# Patient Record
Sex: Male | Born: 1975 | Race: White | Hispanic: No | Marital: Married | Smoking: Current every day smoker
Health system: Southern US, Community
[De-identification: ages and names within clinical notes are randomized; demographics above are authoritative.]

## PROBLEM LIST (undated history)

## (undated) DIAGNOSIS — K6389 Other specified diseases of intestine: Secondary | ICD-10-CM

## (undated) DIAGNOSIS — K529 Noninfective gastroenteritis and colitis, unspecified: Secondary | ICD-10-CM

## (undated) DIAGNOSIS — J351 Hypertrophy of tonsils: Secondary | ICD-10-CM

## (undated) DIAGNOSIS — K5792 Diverticulitis of intestine, part unspecified, without perforation or abscess without bleeding: Secondary | ICD-10-CM

## (undated) DIAGNOSIS — I10 Essential (primary) hypertension: Secondary | ICD-10-CM

## (undated) HISTORY — PX: FOOT SURGERY: SHX648

---

## 2004-11-23 ENCOUNTER — Emergency Department: Payer: Self-pay | Admitting: Emergency Medicine

## 2006-09-19 ENCOUNTER — Emergency Department: Payer: Self-pay | Admitting: Emergency Medicine

## 2007-04-10 ENCOUNTER — Emergency Department: Payer: Self-pay | Admitting: Emergency Medicine

## 2007-08-13 ENCOUNTER — Emergency Department: Payer: Self-pay | Admitting: Emergency Medicine

## 2007-11-22 ENCOUNTER — Other Ambulatory Visit: Payer: Self-pay

## 2007-11-22 ENCOUNTER — Emergency Department: Payer: Self-pay | Admitting: Emergency Medicine

## 2007-11-27 ENCOUNTER — Emergency Department: Payer: Self-pay | Admitting: Emergency Medicine

## 2010-10-01 ENCOUNTER — Emergency Department: Payer: Self-pay | Admitting: Emergency Medicine

## 2010-12-08 ENCOUNTER — Emergency Department: Payer: Self-pay | Admitting: Emergency Medicine

## 2011-01-20 ENCOUNTER — Emergency Department: Payer: Self-pay | Admitting: Emergency Medicine

## 2011-06-23 ENCOUNTER — Emergency Department: Payer: Self-pay | Admitting: Emergency Medicine

## 2011-07-23 ENCOUNTER — Emergency Department: Payer: Self-pay | Admitting: Emergency Medicine

## 2011-07-23 LAB — COMPREHENSIVE METABOLIC PANEL
Albumin: 3.5 g/dL (ref 3.4–5.0)
Anion Gap: 9 (ref 7–16)
Chloride: 108 mmol/L — ABNORMAL HIGH (ref 98–107)
Creatinine: 0.75 mg/dL (ref 0.60–1.30)
EGFR (African American): >60
Glucose: 96 mg/dL (ref 65–99)
Potassium: 3.6 mmol/L (ref 3.5–5.1)
SGPT (ALT): 29 U/L
Total Protein: 6.6 g/dL (ref 6.4–8.2)

## 2011-07-23 LAB — SEDIMENTATION RATE: Erythrocyte Sed Rate: 1 mm/hr (ref 0–15)

## 2011-07-23 LAB — CBC
MCH: 29.9 pg (ref 26.0–34.0)
MCHC: 32.8 g/dL (ref 32.0–36.0)
MCV: 91 fL (ref 80–100)
Platelet: 101 10*3/uL — ABNORMAL LOW (ref 150–440)
RBC: 5 10*6/uL (ref 4.40–5.90)
RDW: 12.9 % (ref 11.5–14.5)

## 2011-07-29 LAB — CULTURE, BLOOD (SINGLE)

## 2011-08-18 ENCOUNTER — Emergency Department: Payer: Self-pay | Admitting: Emergency Medicine

## 2011-08-18 LAB — CBC
HCT: 43.4 % (ref 40.0–52.0)
MCH: 30.2 pg (ref 26.0–34.0)
MCV: 90 fL (ref 80–100)
RBC: 4.82 10*6/uL (ref 4.40–5.90)
RDW: 12.9 % (ref 11.5–14.5)
WBC: 6.3 10*3/uL (ref 3.8–10.6)

## 2011-08-18 LAB — BASIC METABOLIC PANEL
Chloride: 107 mmol/L (ref 98–107)
Co2: 28 mmol/L (ref 21–32)
EGFR (Non-African Amer.): >60
Glucose: 95 mg/dL (ref 65–99)
Osmolality: 279 (ref 275–301)
Sodium: 141 mmol/L (ref 136–145)

## 2011-08-18 LAB — CK TOTAL AND CKMB (NOT AT ARMC): CK, Total: 105 U/L (ref 35–232)

## 2011-08-31 ENCOUNTER — Emergency Department: Payer: Self-pay | Admitting: Emergency Medicine

## 2011-08-31 LAB — CBC
HCT: 42.8 % (ref 40.0–52.0)
Platelet: 108 10*3/uL — ABNORMAL LOW (ref 150–440)
RDW: 13.2 % (ref 11.5–14.5)
WBC: 7.6 10*3/uL (ref 3.8–10.6)

## 2011-08-31 LAB — COMPREHENSIVE METABOLIC PANEL
Albumin: 3.8 g/dL (ref 3.4–5.0)
Alkaline Phosphatase: 107 U/L (ref 50–136)
Anion Gap: 6 — ABNORMAL LOW (ref 7–16)
BUN: 15 mg/dL (ref 7–18)
Bilirubin,Total: 0.4 mg/dL (ref 0.2–1.0)
Creatinine: 1.09 mg/dL (ref 0.60–1.30)
Glucose: 95 mg/dL (ref 65–99)
Osmolality: 282 (ref 275–301)
Potassium: 4 mmol/L (ref 3.5–5.1)
Sodium: 141 mmol/L (ref 136–145)
Total Protein: 7.1 g/dL (ref 6.4–8.2)

## 2011-10-18 ENCOUNTER — Emergency Department: Payer: Self-pay | Admitting: Emergency Medicine

## 2011-10-20 ENCOUNTER — Emergency Department: Payer: Self-pay | Admitting: Emergency Medicine

## 2012-02-25 ENCOUNTER — Emergency Department: Payer: Self-pay | Admitting: Emergency Medicine

## 2012-02-25 LAB — URINALYSIS, COMPLETE
Blood: NEGATIVE
Ketone: NEGATIVE
Leukocyte Esterase: NEGATIVE
Nitrite: NEGATIVE
Ph: 5 (ref 4.5–8.0)
Protein: NEGATIVE

## 2012-02-25 LAB — CBC
HCT: 50 % (ref 40.0–52.0)
HGB: 17.2 g/dL (ref 13.0–18.0)
MCHC: 34.3 g/dL (ref 32.0–36.0)
MCV: 90 fL (ref 80–100)
Platelet: 158 10*3/uL (ref 150–440)
RDW: 13.3 % (ref 11.5–14.5)
WBC: 10.4 10*3/uL (ref 3.8–10.6)

## 2012-03-03 ENCOUNTER — Emergency Department (HOSPITAL_COMMUNITY)
Admission: EM | Admit: 2012-03-03 | Discharge: 2012-03-03 | Disposition: A | Discharge status: Home / Self Care | Payer: Self-pay | Attending: Emergency Medicine | Admitting: Emergency Medicine

## 2012-03-03 ENCOUNTER — Emergency Department (HOSPITAL_COMMUNITY): Payer: Self-pay

## 2012-03-03 ENCOUNTER — Encounter (HOSPITAL_COMMUNITY): Payer: Self-pay | Admitting: *Deleted

## 2012-03-03 DIAGNOSIS — K5900 Constipation, unspecified: Secondary | ICD-10-CM | POA: Insufficient documentation

## 2012-03-03 DIAGNOSIS — R350 Frequency of micturition: Secondary | ICD-10-CM | POA: Insufficient documentation

## 2012-03-03 DIAGNOSIS — K6389 Other specified diseases of intestine: Secondary | ICD-10-CM | POA: Insufficient documentation

## 2012-03-03 DIAGNOSIS — R3915 Urgency of urination: Secondary | ICD-10-CM | POA: Insufficient documentation

## 2012-03-03 DIAGNOSIS — F17200 Nicotine dependence, unspecified, uncomplicated: Secondary | ICD-10-CM | POA: Insufficient documentation

## 2012-03-03 DIAGNOSIS — R300 Dysuria: Secondary | ICD-10-CM | POA: Insufficient documentation

## 2012-03-03 LAB — CBC WITH DIFFERENTIAL/PLATELET
HCT: 45.9 % (ref 39.0–52.0)
Hemoglobin: 15.8 g/dL (ref 13.0–17.0)
Lymphs Abs: 1.8 10*3/uL (ref 0.7–4.0)
MCHC: 34.4 g/dL (ref 30.0–36.0)
Monocytes Absolute: 0.4 10*3/uL (ref 0.1–1.0)
Monocytes Relative: 6 % (ref 3–12)
Neutro Abs: 4 10*3/uL (ref 1.7–7.7)
Neutrophils Relative %: 62 % (ref 43–77)
RBC: 5.27 MIL/uL (ref 4.22–5.81)

## 2012-03-03 LAB — POCT I-STAT, CHEM 8
BUN: 16 mg/dL (ref 6–23)
Creatinine, Ser: 0.9 mg/dL (ref 0.50–1.35)
Hemoglobin: 16 g/dL (ref 13.0–17.0)
Potassium: 3.9 mEq/L (ref 3.5–5.1)
Sodium: 141 mEq/L (ref 135–145)

## 2012-03-03 LAB — URINALYSIS, ROUTINE W REFLEX MICROSCOPIC
Bilirubin Urine: NEGATIVE
Hgb urine dipstick: NEGATIVE
Specific Gravity, Urine: 1.023 (ref 1.005–1.030)
pH: 6 (ref 5.0–8.0)

## 2012-03-03 LAB — LIPASE, BLOOD: Lipase: 22 U/L (ref 11–59)

## 2012-03-03 LAB — COMPREHENSIVE METABOLIC PANEL
Alkaline Phosphatase: 95 U/L (ref 39–117)
BUN: 16 mg/dL (ref 6–23)
CO2: 24 mEq/L (ref 19–32)
Chloride: 103 mEq/L (ref 96–112)
Creatinine, Ser: 0.85 mg/dL (ref 0.50–1.35)
Glucose, Bld: 91 mg/dL (ref 70–99)
Potassium: 3.8 mEq/L (ref 3.5–5.1)
Total Bilirubin: 0.4 mg/dL (ref 0.3–1.2)

## 2012-03-03 LAB — LACTIC ACID, PLASMA: Lactic Acid, Venous: 1 mmol/L (ref 0.5–2.2)

## 2012-03-03 MED ORDER — MAGNESIUM CITRATE PO SOLN
1.0000 | ORAL | Status: AC
  Administered 2012-03-03: 1 via ORAL
  Filled 2012-03-03: qty 296

## 2012-03-03 MED ORDER — IOHEXOL 300 MG/ML  SOLN
80.0000 mL | Freq: Once | INTRAMUSCULAR | Status: AC | PRN
  Administered 2012-03-03: 80 mL via INTRAVENOUS

## 2012-03-03 MED ORDER — IOHEXOL 300 MG/ML  SOLN
20.0000 mL | INTRAMUSCULAR | Status: AC
  Administered 2012-03-03: 20 mL via ORAL

## 2012-03-03 MED ORDER — POLYETHYLENE GLYCOL 3350 17 GM/SCOOP PO POWD: 17.0000 g | Freq: Every day | ORAL | Status: DC

## 2012-03-03 NOTE — ED Provider Notes (Signed)
History     CSN: [id number]  Arrival date & time 03/03/12  1158   First MD Initiated Contact with Patient 03/03/12 1218      Chief Complaint  Patient presents with  . Abdominal Pain    (Consider location/radiation/quality/duration/timing/severity/associated sxs/prior treatment) Patient is a 37 y.o. male presenting with abdominal pain. The history is provided by the patient.  Abdominal Pain The primary symptoms of the illness include abdominal pain and dysuria. The primary symptoms of the illness do not include fever, nausea, vomiting or diarrhea. Episode onset: Approximately one week ago. The onset of the illness was gradual. The problem has not changed since onset. The abdominal pain is generalized (Initially started in the lower abdomen but now hurts everywhere). The abdominal pain does not radiate. The severity of the abdominal pain is 9/10. The abdominal pain is relieved by nothing. Exacerbated by: Eating does not affect pain. States in the last few days he's been constipated and has to strain to get out hard stool.  The dysuria began yesterday. The dysuria is associated with frequency and urgency. The dysuria is not associated with discharge.  Additional symptoms associated with the illness include constipation, urgency and frequency. Significant associated medical issues do not include gallstones or diverticulitis.    History reviewed. No pertinent past medical history.  Past Surgical History  Procedure Date  . Knee surgery     No family history on file.  History  Substance Use Topics  . Smoking status: Current Every Day Smoker  . Smokeless tobacco: Not on file  . Alcohol Use: Yes      Review of Systems  Constitutional: Negative for fever.  Gastrointestinal: Positive for abdominal pain and constipation. Negative for nausea, vomiting and diarrhea.  Genitourinary: Positive for dysuria, urgency and frequency.  All other systems reviewed and are  negative.    Allergies  Bee venom and Oxycontin  Home Medications  No current outpatient prescriptions on file.  BP 125/85  Pulse 63  Temp 98.3 F (36.8 C) (Oral)  Resp 18  SpO2 96%  Physical Exam  Nursing note and vitals reviewed. Constitutional: He is oriented to person, place, and time. He appears well-developed and well-nourished. No distress.  HENT:  Head: Normocephalic and atraumatic.  Mouth/Throat: Oropharynx is clear and moist.  Eyes: Conjunctivae normal and EOM are normal. Pupils are equal, round, and reactive to light.  Neck: Normal range of motion. Neck supple.  Cardiovascular: Normal rate, regular rhythm and intact distal pulses.   No murmur heard. Pulmonary/Chest: Effort normal and breath sounds normal. No respiratory distress. He has no wheezes. He has no rales.  Abdominal: Soft. Normal appearance and bowel sounds are normal. He exhibits no distension. There is generalized tenderness. There is rebound and guarding. There is no CVA tenderness.  Musculoskeletal: Normal range of motion. He exhibits no edema and no tenderness.  Neurological: He is alert and oriented to person, place, and time.  Skin: Skin is warm and dry. No rash noted. No erythema.  Psychiatric: He has a normal mood and affect. His behavior is normal.    ED Course  Procedures (including critical care time)  Labs Reviewed  CBC WITH DIFFERENTIAL - Abnormal; Notable for the following:    Platelets 143 (*)     All other components within normal limits  COMPREHENSIVE METABOLIC PANEL  URINALYSIS, ROUTINE W REFLEX MICROSCOPIC  LACTIC ACID, PLASMA  POCT I-STAT, CHEM 8  LIPASE, BLOOD   Ct Abdomen Pelvis W Contrast  03/03/2012  *RADIOLOGY  REPORT*  Clinical Data: Lower abdominal pain  CT ABDOMEN AND PELVIS WITH CONTRAST  Technique:  Multidetector CT imaging of the abdomen and pelvis was performed following the standard protocol during bolus administration of intravenous contrast.  Contrast: 100 ml  Omnipaque 300 IV contrast  Comparison: Abdominal radiograph same date  Findings: Minimal dependent bibasilar atelectasis.  Possible gallstone image 19, without other CT evidence for acute cholecystitis, versus noise artifact.  Liver, adrenal glands, kidneys, spleen, pancreas are unremarkable.  No lymphadenopathy, ascites, or free air.  The appendix is normal.  To oval fat containing mass like structures are noted adjacent to the otherwise normal appearing sigmoid colon, images 82 and 83, respectively.  No surrounding fluid collection is identified. Minimal adjacent mesenteric stranding is present on image 83.  No bowel wall thickening or focal segmental dilatation.  The bladder is normal.  No pelvic free fluid or lymphadenopathy. No acute osseous finding.  IMPRESSION: Epiploic appendagitis adjacent to the otherwise normal appearing sigmoid colon.  This may be symptomatic but does not require surgical or other intervention.  No other etiology to explain the history of abdominal pain.   Original Report Authenticated By: Christiana Pellant, M.D.    Dg Abd 2 Views  03/03/2012  *RADIOLOGY REPORT*  Clinical Data: 1-week history of abdominal pain and constipation.  ABDOMEN - 2 VIEW  Comparison: None.  Findings: Bowel gas pattern unremarkable without evidence of obstruction or significant ileus.  No evidence of free air or significant air fluid levels on the erect image.  Large stool burden throughout the colon.  No abnormal calcifications.  Regional skeleton intact.  IMPRESSION: No acute abdominal abnormality.  Large stool burden.   Original Report Authenticated By: Hulan Saas, M.D.      No diagnosis found.    MDM   Patient presents with worsening abdominal pain since Sunday. He states that he was seen at [facility] and had a CT scan of his abdomen on Monday which showed epiploic appendage.  He's been taking pain and nausea medicine and now complains of being constipated with worsening pain. He denies  fever, vomiting and is able to eat without difficulty. On exam he has guarding and rebound in all quadrants. But has normal vital signs. Unable to pull up films from the outside hospital.  CBC, CMP, UA, lipase, lactic acid pending.  Acute abdominal series pending. Tissue could have ruptured appendicitis versus constipation versus colitis.  Also he is complaining of mild dysuria and a UA is also pending.  2:03 PM All labs including urine within normal limits. Plain films show a large stool burden however this does not explain his severe abdominal tenderness. CT pending   4:47 PM Epiploic appendicitis but no signs of acute surgical intervention needed.  Feel patient's pain may be due to the constipation. We'll give him stool softeners he is still eating without any vomiting and will have him followup with his PCP   Gwyneth Sprout, MD 03/03/12 [id number]

## 2012-03-03 NOTE — ED Notes (Addendum)
Went to [facility] last Monday for "appendicitis/twisted." prescribed percocet and Zofran, ibuprofen. Still has his meds that he has not finished. Told to come to ED after 4 days if pain is unresolved. Feel constipated.

## 2012-03-03 NOTE — ED Notes (Signed)
Patient transported to X-ray 

## 2012-05-21 ENCOUNTER — Emergency Department (HOSPITAL_COMMUNITY): Payer: Self-pay

## 2012-05-21 ENCOUNTER — Encounter (HOSPITAL_COMMUNITY): Payer: Self-pay | Admitting: Emergency Medicine

## 2012-05-21 ENCOUNTER — Emergency Department (HOSPITAL_COMMUNITY)
Admission: EM | Admit: 2012-05-21 | Discharge: 2012-05-21 | Disposition: A | Discharge status: Home / Self Care | Payer: Self-pay | Attending: Emergency Medicine | Admitting: Emergency Medicine

## 2012-05-21 DIAGNOSIS — G8929 Other chronic pain: Secondary | ICD-10-CM | POA: Insufficient documentation

## 2012-05-21 DIAGNOSIS — M2392 Unspecified internal derangement of left knee: Secondary | ICD-10-CM

## 2012-05-21 DIAGNOSIS — M25569 Pain in unspecified knee: Secondary | ICD-10-CM | POA: Insufficient documentation

## 2012-05-21 DIAGNOSIS — Z9889 Other specified postprocedural states: Secondary | ICD-10-CM | POA: Insufficient documentation

## 2012-05-21 DIAGNOSIS — F17200 Nicotine dependence, unspecified, uncomplicated: Secondary | ICD-10-CM | POA: Insufficient documentation

## 2012-05-21 DIAGNOSIS — M2390 Unspecified internal derangement of unspecified knee: Secondary | ICD-10-CM | POA: Insufficient documentation

## 2012-05-21 MED ORDER — HYDROCODONE-ACETAMINOPHEN 5-325 MG PO TABS: 2.0000 | ORAL_TABLET | Freq: Three times a day (TID) | ORAL | Status: DC | PRN

## 2012-05-21 NOTE — ED Provider Notes (Signed)
History  This chart was scribed for Hurman Horn, MD by Ardeen Jourdain, ED Scribe. This patient was seen in room TR11C/TR11C and the patient's care was started at 1830.  CSN: [id number]  Arrival date & time 05/21/12  1341   None     Chief Complaint  Patient presents with  . Knee Pain     The history is provided by the patient. No language interpreter was used.   Thomas Arroyo is a 36 y.o. male with a h/o chronic left knee pain who presents to the Emergency Department complaining of constant, gradually worsening, gradual onset left knee joint pain. He states the pain began a few years ago. He states the knee will occasionally lock and give way. He states the severity of the knee pain will wax and wane. He states he has chronic severe pain and numbness in both feet at baseline. He denies any changes in the pain and numbness. He denies any injury or trauma to the area. Pt denies fever, neck pain, sore throat, visual disturbance, CP, cough, SOB, abdominal pain, nausea, emesis, diarrhea, urinary symptoms, back pain, HA, weakness, numbness and rash as associated symptoms.     History reviewed. No pertinent past medical history.  Past Surgical History  Procedure Laterality Date  . Knee surgery      History reviewed. No pertinent family history.  History  Substance Use Topics  . Smoking status: Current Every Day Smoker  . Smokeless tobacco: Not on file  . Alcohol Use: Yes      Review of Systems  10 Systems reviewed and all are negative for acute change except as noted in the HPI.   Allergies  Bee venom and Oxycontin  Home Medications   Current Outpatient Rx  Name  Route  Sig  Dispense  Refill  . ibuprofen (ADVIL,MOTRIN) 200 MG tablet   Oral   Take 400 mg by mouth every 6 (six) hours as needed for headache.         Marland Kitchen HYDROcodone-acetaminophen (NORCO) 5-325 MG per tablet   Oral   Take 2 tablets by mouth every 8 (eight) hours as needed for pain.   12 tablet   0      Triage Vitals: BP 140/101  Pulse 60  Temp(Src) 97.9 F (36.6 C) (Oral)  Resp 18  SpO2 97%  Physical Exam  Nursing note and vitals reviewed. Constitutional:  Awake, alert, nontoxic appearance.  HENT:  Head: Atraumatic.  Eyes: Right eye exhibits no discharge. Left eye exhibits no discharge.  Neck: Neck supple.  Pulmonary/Chest: Effort normal. He exhibits no tenderness.  Abdominal: Soft. There is no tenderness. There is no rebound.  Musculoskeletal: He exhibits tenderness. He exhibits no edema.  Diffusely tender left knee, non tender thigh and calf, no edema, DP pulse intact, CR < 2 seconds all toes, baseline and baseline numbness left foot , negative Lachman's negative laxity with verus and valgus stress, positive abnormal McMurry's  Neurological:  Mental status and motor strength appears baseline for patient and situation.  Skin: No rash noted.  Psychiatric: He has a normal mood and affect.    ED Course  Procedures (including critical care time)  DIAGNOSTIC STUDIES: Oxygen Saturation is 97% on room air, normal by my interpretation.    COORDINATION OF CARE:  6:36 PM:  Patient / Family / Caregiver informed of clinical course, understand medical decision-making process, and agree with plan.     Labs Reviewed - No data to display No results found.  1. Chronic knee pain, left   2. Internal derangement of left knee       MDM  I personally performed the services described in this documentation, which was scribed in my presence. The recorded information has been reviewed and is accurate. I doubt any other EMC precluding discharge at this time including, but not necessarily limited to the following:SBI.   Hurman Horn, MD 05/25/12 2207

## 2012-05-21 NOTE — ED Notes (Signed)
Pt reports left knee pain intermittently for past two years. Pt is a war veteran and has seen a [facility] doc in the past for leg pain, but not the knee pain. Pt is to have a [facility] doctor assigned to him tomorrow but will not be able to see the Doctor for a few months. Pt reports pain on flexion and extension. No visible swelling or injury noted. Pt rates pian 10/10. Pt has good popliteal and pedal pulse on left leg.

## 2012-05-21 NOTE — ED Notes (Signed)
Pt c/o left knee pain that is chronic in nature

## 2013-01-05 ENCOUNTER — Emergency Department: Payer: Self-pay | Admitting: Emergency Medicine

## 2013-01-21 ENCOUNTER — Emergency Department: Payer: Self-pay | Admitting: Emergency Medicine

## 2013-04-08 ENCOUNTER — Emergency Department (HOSPITAL_COMMUNITY)
Admission: EM | Admit: 2013-04-08 | Discharge: 2013-04-08 | Disposition: A | Discharge status: Home / Self Care | Payer: Non-veteran care | Attending: Emergency Medicine | Admitting: Emergency Medicine

## 2013-04-08 ENCOUNTER — Emergency Department (HOSPITAL_COMMUNITY): Payer: Non-veteran care

## 2013-04-08 ENCOUNTER — Encounter (HOSPITAL_COMMUNITY): Payer: Self-pay | Admitting: Emergency Medicine

## 2013-04-08 DIAGNOSIS — J029 Acute pharyngitis, unspecified: Secondary | ICD-10-CM | POA: Insufficient documentation

## 2013-04-08 DIAGNOSIS — R0602 Shortness of breath: Secondary | ICD-10-CM | POA: Insufficient documentation

## 2013-04-08 DIAGNOSIS — R590 Localized enlarged lymph nodes: Secondary | ICD-10-CM

## 2013-04-08 DIAGNOSIS — Z8619 Personal history of other infectious and parasitic diseases: Secondary | ICD-10-CM | POA: Insufficient documentation

## 2013-04-08 DIAGNOSIS — R000 Tachycardia, unspecified: Secondary | ICD-10-CM | POA: Insufficient documentation

## 2013-04-08 DIAGNOSIS — Z79899 Other long term (current) drug therapy: Secondary | ICD-10-CM | POA: Insufficient documentation

## 2013-04-08 DIAGNOSIS — F17200 Nicotine dependence, unspecified, uncomplicated: Secondary | ICD-10-CM | POA: Insufficient documentation

## 2013-04-08 HISTORY — DX: Hypertrophy of tonsils: J35.1

## 2013-04-08 HISTORY — DX: Essential (primary) hypertension: I10

## 2013-04-08 LAB — BASIC METABOLIC PANEL
BUN: 13 mg/dL (ref 6–23)
CO2: 22 meq/L (ref 19–32)
Calcium: 9.1 mg/dL (ref 8.4–10.5)
Chloride: 107 mEq/L (ref 96–112)
Creatinine, Ser: 0.81 mg/dL (ref 0.50–1.35)
GFR calc Af Amer: >90 mL/min (ref >90)
GFR calc non Af Amer: >90 mL/min (ref >90)
GLUCOSE: 115 mg/dL — AB (ref 70–99)
POTASSIUM: 4.1 meq/L (ref 3.7–5.3)
SODIUM: 143 meq/L (ref 137–147)

## 2013-04-08 LAB — CBC WITH DIFFERENTIAL/PLATELET
Basophils Absolute: 0.1 10*3/uL (ref 0.0–0.1)
Basophils Relative: 1 % (ref 0–1)
Eosinophils Absolute: 0.4 10*3/uL (ref 0.0–0.7)
Eosinophils Relative: 4 % (ref 0–5)
HCT: 46 % (ref 39.0–52.0)
HEMOGLOBIN: 16.1 g/dL (ref 13.0–17.0)
LYMPHS ABS: 2.1 10*3/uL (ref 0.7–4.0)
LYMPHS PCT: 24 % (ref 12–46)
MCH: 31.1 pg (ref 26.0–34.0)
MCHC: 35 g/dL (ref 30.0–36.0)
MCV: 89 fL (ref 78.0–100.0)
Monocytes Absolute: 0.4 10*3/uL (ref 0.1–1.0)
Monocytes Relative: 5 % (ref 3–12)
NEUTROS PCT: 66 % (ref 43–77)
Neutro Abs: 5.9 10*3/uL (ref 1.7–7.7)
PLATELETS: 153 10*3/uL (ref 150–400)
RBC: 5.17 MIL/uL (ref 4.22–5.81)
RDW: 12.7 % (ref 11.5–15.5)
WBC: 8.8 10*3/uL (ref 4.0–10.5)

## 2013-04-08 LAB — RAPID STREP SCREEN (MED CTR MEBANE ONLY): Streptococcus, Group A Screen (Direct): NEGATIVE

## 2013-04-08 MED ORDER — HYDROCODONE-ACETAMINOPHEN 7.5-325 MG/15ML PO SOLN: 15.0000 mL | Freq: Three times a day (TID) | ORAL | Status: DC | PRN

## 2013-04-08 MED ORDER — SODIUM CHLORIDE 0.9 % IV BOLUS (SEPSIS)
1000.0000 mL | Freq: Once | INTRAVENOUS | Status: AC
  Administered 2013-04-08: 1000 mL via INTRAVENOUS

## 2013-04-08 MED ORDER — DEXAMETHASONE SODIUM PHOSPHATE 4 MG/ML IJ SOLN
8.0000 mg | Freq: Once | INTRAMUSCULAR | Status: AC
  Administered 2013-04-08: 8 mg via INTRAVENOUS
  Filled 2013-04-08: qty 2

## 2013-04-08 MED ORDER — PENICILLIN G BENZATHINE 1200000 UNIT/2ML IM SUSP
1.2000 10*6.[IU] | Freq: Once | INTRAMUSCULAR | Status: AC
  Administered 2013-04-08: 1.2 10*6.[IU] via INTRAMUSCULAR
  Filled 2013-04-08: qty 2

## 2013-04-08 MED ORDER — IOHEXOL 300 MG/ML  SOLN
75.0000 mL | Freq: Once | INTRAMUSCULAR | Status: AC | PRN
  Administered 2013-04-08: 75 mL via INTRAVENOUS

## 2013-04-08 MED ORDER — KETOROLAC TROMETHAMINE 30 MG/ML IJ SOLN
30.0000 mg | Freq: Once | INTRAMUSCULAR | Status: AC
  Administered 2013-04-08: 30 mg via INTRAVENOUS
  Filled 2013-04-08: qty 1

## 2013-04-08 NOTE — Discharge Instructions (Signed)
You have been treated here for strep throat. Follow up with ENT. Return to the ED with worsening or concerning symptoms. Refer to attached documents for more information.

## 2013-04-08 NOTE — ED Notes (Signed)
Pt writes since he can't talk from pain that this is the 10 th time his tonsils have swollen up and it is worse this time. Trouble swallowing

## 2013-04-08 NOTE — ED Notes (Signed)
Antibiotic held per PA until her further assessment.

## 2013-04-08 NOTE — ED Provider Notes (Signed)
CSN: [id number]     Arrival date & time 04/08/13  1539 History  This chart was scribed for Emilia BeckKaitlyn Payson Crumby, PA working with Gwyneth SproutWhitney Plunkett, MD by Quintella ReichertMatthew Underwood, ED Scribe. This patient was seen in room TR05C/TR05C and the patient's care was started at 5:30 PM.   Chief Complaint  Patient presents with  . Sore Throat    The history is provided by the patient. No language interpreter was used.    HPI Comments: Thomas Arroyo is a 38 y.o. male who presents to the Emergency Department complaining of 2 days of severe sore throat.  Pt is writing because his pain is greatly worsened by speaking.  He states his pain is more severe on the left.  Pain radiates into the left side of his neck.  He also states he is unable to swallow and feels mildly short of breath.  He denies fever.  Pt states that he has had strep throat 2 times over the past 5 months, and has been seen 10 times for swollen tonsils.  However he reports that his current sore throat is much more severe than any past episodes.  He has been seen for this at the [facility].   Past Medical History  Diagnosis Date  . Enlarged tonsils     Past Surgical History  Procedure Laterality Date  . Knee surgery      No family history on file.   History  Substance Use Topics  . Smoking status: Current Every Day Smoker    Types: Cigarettes  . Smokeless tobacco: Not on file  . Alcohol Use: No     Review of Systems  Constitutional: Negative for fever.  HENT: Positive for sore throat and trouble swallowing.   Respiratory: Positive for shortness of breath.   All other systems reviewed and are negative.     Allergies  Bee venom and Oxycontin  Home Medications   Current Outpatient Rx  Name  Route  Sig  Dispense  Refill  . EPINEPHrine (EPI-PEN) 0.3 mg/0.3 mL SOAJ injection   Intramuscular   Inject 0.3 mg into the muscle once.          BP 130/89  Pulse 112  Temp(Src) 98.6 F (37 C) (Oral)  Resp 20  Ht 6\' 2"  (1.88 m)  Wt  220 lb (99.791 kg)  BMI 28.23 kg/m2  SpO2 97%  Physical Exam  Nursing note and vitals reviewed. Constitutional: He is oriented to person, place, and time. He appears well-developed and well-nourished. No distress.  HENT:  Head: Normocephalic and atraumatic.  Unilateral tonsillar swelling on the right.  Uvular deviation to the left.  Erythematous and edematous uvula.    Eyes: EOM are normal.  Neck: Neck supple. No tracheal deviation present.  Cardiovascular: Regular rhythm and normal heart sounds.  Tachycardia present.   No murmur heard. Pulmonary/Chest: Effort normal and breath sounds normal. No respiratory distress. He has no wheezes. He has no rales.  Musculoskeletal: Normal range of motion.  Lymphadenopathy:    He has cervical adenopathy.  Cervical adenopathy bilaterally. More tender on the left.  Neurological: He is alert and oriented to person, place, and time.  Skin: Skin is warm and dry.  Psychiatric: He has a normal mood and affect. His behavior is normal.    ED Course  Procedures (including critical care time)  DIAGNOSTIC STUDIES: Oxygen Saturation is 97% on room air, normal by my interpretation.    COORDINATION OF CARE: 5:34 PM-Discussed treatment plan which includes labs and  CT-scan with pt at bedside and pt agreed to plan.    Labs Review Labs Reviewed  BASIC METABOLIC PANEL - Abnormal; Notable for the following:    Glucose, Bld 115 (*)    All other components within normal limits  RAPID STREP SCREEN  CULTURE, GROUP A STREP  CBC WITH DIFFERENTIAL   Imaging Review Ct Soft Tissue Neck W Contrast  04/08/2013   CLINICAL DATA:  Sore throat for 2 days, worse on the left.  EXAM: CT NECK WITH CONTRAST  TECHNIQUE: Multidetector CT imaging of the neck was performed using the standard protocol following the bolus administration of intravenous contrast.  CONTRAST:  75 mL OMNIPAQUE IOHEXOL 300 MG/ML  SOLN  COMPARISON:  None.  FINDINGS: There is no abscess. No mass is  identified. Visualized intracranial contents appear normal. Major salivary glands are normal in appearance. Scattered, small lymph nodes are noted. There are no pathologically enlarged lymph and no suppurative adenitis. Mild mucosal thickening is seen in the maxillary sinuses bilaterally. Major caliber vascular structures are patent. The lung apices are clear. No focal bony abnormality is identified.  IMPRESSION: Negative for abscess or other acute abnormality.  Mucosal thickening maxillary sinuses bilaterally.   Electronically Signed   By: Drusilla Kanner M.D.   On: 04/08/2013 18:38    EKG Interpretation   None       MDM   Final diagnoses:  None  1. Cervical lymphadenopathy 2. Sore throat  7:16 PM Labs and strep test unremarkable for acute changes. CT neck unremarkable for abscess or other acute changes besides slightly enlarged lymph nodes. I will treat the patient for strep here with IM Pen G. Patient given fluids, toradol and decadron for symptoms. Patient advised to follow up with ENT. Patient instructed to return with worsening or concerning symptoms.     I personally performed the services described in this documentation, which was scribed in my presence. The recorded information has been reviewed and is accurate.    Emilia Beck, [place] 04/08/13 1922

## 2013-04-08 NOTE — ED Notes (Signed)
PT ambulated with baseline gait; VSS; A&Ox3; no signs of distress; respirations even and unlabored; skin warm and dry; no questions upon discharge.  

## 2013-04-09 NOTE — ED Provider Notes (Signed)
Medical screening examination/treatment/procedure(s) were performed by non-physician practitioner and as supervising physician I was immediately available for consultation/collaboration.  EKG Interpretation   None         Gwyneth SproutWhitney Takeila Thayne, MD 04/09/13 [phone]

## 2013-04-10 LAB — CULTURE, GROUP A STREP

## 2013-10-28 ENCOUNTER — Emergency Department: Payer: Self-pay | Admitting: Student

## 2013-10-30 ENCOUNTER — Emergency Department: Payer: Self-pay | Admitting: Emergency Medicine

## 2013-11-13 ENCOUNTER — Emergency Department: Payer: Self-pay | Admitting: Emergency Medicine

## 2013-11-30 ENCOUNTER — Emergency Department: Payer: Self-pay | Admitting: Emergency Medicine

## 2014-01-09 ENCOUNTER — Emergency Department: Payer: Self-pay | Admitting: Internal Medicine

## 2014-01-14 ENCOUNTER — Emergency Department: Payer: Self-pay | Admitting: Emergency Medicine

## 2014-03-10 ENCOUNTER — Emergency Department: Payer: Self-pay | Admitting: Emergency Medicine

## 2014-03-10 LAB — COMPREHENSIVE METABOLIC PANEL
ALK PHOS: 112 U/L
ALT: 48 U/L
ANION GAP: 7 (ref 7–16)
AST: 24 U/L (ref 15–37)
Albumin: 3.9 g/dL (ref 3.4–5.0)
BUN: 6 mg/dL — AB (ref 7–18)
Bilirubin,Total: 0.8 mg/dL (ref 0.2–1.0)
CO2: 28 mmol/L (ref 21–32)
Calcium, Total: 9 mg/dL (ref 8.5–10.1)
Chloride: 104 mmol/L (ref 98–107)
Creatinine: 0.99 mg/dL (ref 0.60–1.30)
EGFR (African American): >60
Glucose: 111 mg/dL — ABNORMAL HIGH (ref 65–99)
Osmolality: 276 (ref 275–301)
Potassium: 3.4 mmol/L — ABNORMAL LOW (ref 3.5–5.1)
Sodium: 139 mmol/L (ref 136–145)
Total Protein: 7.4 g/dL (ref 6.4–8.2)

## 2014-03-10 LAB — CBC WITH DIFFERENTIAL/PLATELET
BASOS ABS: 0.1 10*3/uL (ref 0.0–0.1)
BASOS PCT: 0.9 %
Eosinophil #: 0.3 10*3/uL (ref 0.0–0.7)
Eosinophil %: 3.4 %
HCT: 47.1 % (ref 40.0–52.0)
HGB: 15.8 g/dL (ref 13.0–18.0)
Lymphocyte #: 1.3 10*3/uL (ref 1.0–3.6)
Lymphocyte %: 15.3 %
MCH: 30.6 pg (ref 26.0–34.0)
MCHC: 33.6 g/dL (ref 32.0–36.0)
MCV: 91 fL (ref 80–100)
Monocyte #: 0.5 x10 3/mm (ref 0.2–1.0)
Monocyte %: 5.9 %
Neutrophil #: 6.5 10*3/uL (ref 1.4–6.5)
Neutrophil %: 74.5 %
Platelet: 133 10*3/uL — ABNORMAL LOW (ref 150–440)
RBC: 5.17 10*6/uL (ref 4.40–5.90)
RDW: 13.5 % (ref 11.5–14.5)
WBC: 8.7 10*3/uL (ref 3.8–10.6)

## 2014-03-10 LAB — TROPONIN I

## 2014-03-10 LAB — LIPASE, BLOOD: Lipase: 76 U/L (ref 73–393)

## 2014-12-25 ENCOUNTER — Emergency Department
Admission: EM | Admit: 2014-12-25 | Discharge: 2014-12-26 | Disposition: A | Discharge status: Home / Self Care | Payer: Non-veteran care | Attending: Emergency Medicine | Admitting: Emergency Medicine

## 2014-12-25 ENCOUNTER — Emergency Department: Payer: Non-veteran care

## 2014-12-25 ENCOUNTER — Encounter: Payer: Self-pay | Admitting: Emergency Medicine

## 2014-12-25 DIAGNOSIS — Z79899 Other long term (current) drug therapy: Secondary | ICD-10-CM | POA: Insufficient documentation

## 2014-12-25 DIAGNOSIS — R042 Hemoptysis: Secondary | ICD-10-CM

## 2014-12-25 DIAGNOSIS — I10 Essential (primary) hypertension: Secondary | ICD-10-CM | POA: Insufficient documentation

## 2014-12-25 DIAGNOSIS — R69 Illness, unspecified: Secondary | ICD-10-CM

## 2014-12-25 DIAGNOSIS — R6883 Chills (without fever): Secondary | ICD-10-CM

## 2014-12-25 DIAGNOSIS — Z720 Tobacco use: Secondary | ICD-10-CM | POA: Insufficient documentation

## 2014-12-25 DIAGNOSIS — J111 Influenza due to unidentified influenza virus with other respiratory manifestations: Secondary | ICD-10-CM | POA: Insufficient documentation

## 2014-12-25 DIAGNOSIS — R509 Fever, unspecified: Secondary | ICD-10-CM

## 2014-12-25 LAB — COMPREHENSIVE METABOLIC PANEL
ALT: 47 U/L (ref 17–63)
AST: 28 U/L (ref 15–41)
Albumin: 4.5 g/dL (ref 3.5–5.0)
Alkaline Phosphatase: 103 U/L (ref 38–126)
Anion gap: 8 (ref 5–15)
BUN: 13 mg/dL (ref 6–20)
CO2: 27 mmol/L (ref 22–32)
CREATININE: 1.07 mg/dL (ref 0.61–1.24)
Calcium: 10.3 mg/dL (ref 8.9–10.3)
Chloride: 104 mmol/L (ref 101–111)
GFR calc non Af Amer: >60 mL/min (ref >60)
Glucose, Bld: 118 mg/dL — ABNORMAL HIGH (ref 65–99)
Potassium: 4.4 mmol/L (ref 3.5–5.1)
Sodium: 139 mmol/L (ref 135–145)
Total Bilirubin: 0.9 mg/dL (ref 0.3–1.2)
Total Protein: 7.7 g/dL (ref 6.5–8.1)

## 2014-12-25 LAB — CBC WITH DIFFERENTIAL/PLATELET
Basophils Absolute: 0.1 10*3/uL (ref 0–0.1)
Basophils Relative: 1 %
Eosinophils Absolute: 0.3 10*3/uL (ref 0–0.7)
Eosinophils Relative: 3 %
HCT: 46.8 % (ref 40.0–52.0)
Hemoglobin: 15.9 g/dL (ref 13.0–18.0)
LYMPHS PCT: 12 %
Lymphs Abs: 1.5 10*3/uL (ref 1.0–3.6)
MCH: 30.5 pg (ref 26.0–34.0)
MCHC: 33.9 g/dL (ref 32.0–36.0)
MCV: 89.9 fL (ref 80.0–100.0)
Monocytes Absolute: 0.8 10*3/uL (ref 0.2–1.0)
Monocytes Relative: 7 %
Neutro Abs: 9.3 10*3/uL — ABNORMAL HIGH (ref 1.4–6.5)
Neutrophils Relative %: 77 %
Platelets: 122 10*3/uL — ABNORMAL LOW (ref 150–440)
RBC: 5.21 MIL/uL (ref 4.40–5.90)
RDW: 13.1 % (ref 11.5–14.5)
WBC: 11.9 10*3/uL — ABNORMAL HIGH (ref 3.8–10.6)

## 2014-12-25 LAB — RAPID INFLUENZA A&B ANTIGENS
Influenza A (ARMC): NEGATIVE
Influenza B (ARMC): NEGATIVE

## 2014-12-25 MED ORDER — DEXAMETHASONE SODIUM PHOSPHATE 10 MG/ML IJ SOLN
10.0000 mg | Freq: Once | INTRAMUSCULAR | Status: AC
  Administered 2014-12-26: 10 mg via INTRAVENOUS
  Filled 2014-12-25: qty 1

## 2014-12-25 MED ORDER — AZITHROMYCIN 250 MG PO TABS: ORAL_TABLET | ORAL | Status: DC

## 2014-12-25 MED ORDER — ONDANSETRON HCL 4 MG PO TABS: ORAL_TABLET | ORAL | Status: DC

## 2014-12-25 MED ORDER — SODIUM CHLORIDE 0.9 % IV BOLUS (SEPSIS)
1000.0000 mL | INTRAVENOUS | Status: AC
  Administered 2014-12-25: 1000 mL via INTRAVENOUS

## 2014-12-25 NOTE — Discharge Instructions (Signed)
You have been seen in the Emergency Department (ED) today for a likely viral illness ("influenza-like illness").  Please drink plenty of clear fluids (water, Gatorade, chicken broth, etc).  You may use Tylenol and/or Motrin according to label instructions.  You can alternate between the two without any side effects.  Given the fact that he may have what is known as an "atypical" pneumonia, we have also prescribed a short course of antibiotics.  We also gave you a dose of Decadron to help with the pain and inflammation in her throat.    Please follow up with your doctor as listed above.  Call your doctor or return to the Emergency Department (ED) if you are unable to tolerate fluids due to vomiting, have worsening trouble breathing, become extremely tired or difficult to awaken, or if you develop any other symptoms that concern you.   Upper Respiratory Infection, Adult Most upper respiratory infections (URIs) are a viral infection of the air passages leading to the lungs. A URI affects the nose, throat, and upper air passages. The most common type of URI is nasopharyngitis and is typically referred to as "the common cold." URIs run their course and usually go away on their own. Most of the time, a URI does not require medical attention, but sometimes a bacterial infection in the upper airways can follow a viral infection. This is called a secondary infection. Sinus and middle ear infections are common types of secondary upper respiratory infections. Bacterial pneumonia can also complicate a URI. A URI can worsen asthma and chronic obstructive pulmonary disease (COPD). Sometimes, these complications can require emergency medical care and may be life threatening.  CAUSES Almost all URIs are caused by viruses. A virus is a type of germ and can spread from one person to another.  RISKS FACTORS You may be at risk for a URI if:   You smoke.   You have chronic heart or lung disease.  You have a weakened  defense (immune) system.   You are very young or very old.   You have nasal allergies or asthma.  You work in crowded or poorly ventilated areas.  You work in health care facilities or schools. SIGNS AND SYMPTOMS  Symptoms typically develop 2-3 days after you come in contact with a cold virus. Most viral URIs last 7-10 days. However, viral URIs from the influenza virus (flu virus) can last 14-18 days and are typically more severe. Symptoms may include:   Runny or stuffy (congested) nose.   Sneezing.   Cough.   Sore throat.   Headache.   Fatigue.   Fever.   Loss of appetite.   Pain in your forehead, behind your eyes, and over your cheekbones (sinus pain).  Muscle aches.  DIAGNOSIS  Your health care provider may diagnose a URI by:  Physical exam.  Tests to check that your symptoms are not due to another condition such as:  Strep throat.  Sinusitis.  Pneumonia.  Asthma. TREATMENT  A URI goes away on its own with time. It cannot be cured with medicines, but medicines may be prescribed or recommended to relieve symptoms. Medicines may help:  Reduce your fever.  Reduce your cough.  Relieve nasal congestion. HOME CARE INSTRUCTIONS   Take medicines only as directed by your health care provider.   Gargle warm saltwater or take cough drops to comfort your throat as directed by your health care provider.  Use a warm mist humidifier or inhale steam from a shower to  increase air moisture. This may make it easier to breathe.  Drink enough fluid to keep your urine clear or pale yellow.   Eat soups and other clear broths and maintain good nutrition.   Rest as needed.   Return to work when your temperature has returned to normal or as your health care provider advises. You may need to stay home longer to avoid infecting others. You can also use a face mask and careful hand washing to prevent spread of the virus.  Increase the usage of your inhaler if  you have asthma.   Do not use any tobacco products, including cigarettes, chewing tobacco, or electronic cigarettes. If you need help quitting, ask your health care provider. PREVENTION  The best way to protect yourself from getting a cold is to practice good hygiene.   Avoid oral or hand contact with people with cold symptoms.   Wash your hands often if contact occurs.  There is no clear evidence that vitamin C, vitamin E, echinacea, or exercise reduces the chance of developing a cold. However, it is always recommended to get plenty of rest, exercise, and practice good nutrition.  SEEK MEDICAL CARE IF:   You are getting worse rather than better.   Your symptoms are not controlled by medicine.   You have chills.  You have worsening shortness of breath.  You have brown or red mucus.  You have yellow or brown nasal discharge.  You have pain in your face, especially when you bend forward.  You have a fever.  You have swollen neck glands.  You have pain while swallowing.  You have white areas in the back of your throat. SEEK IMMEDIATE MEDICAL CARE IF:   You have severe or persistent:  Headache.  Ear pain.  Sinus pain.  Chest pain.  You have chronic lung disease and any of the following:  Wheezing.  Prolonged cough.  Coughing up blood.  A change in your usual mucus.  You have a stiff neck.  You have changes in your:  Vision.  Hearing.  Thinking.  Mood. MAKE SURE YOU:   Understand these instructions.  Will watch your condition.  Will get help right away if you are not doing well or get worse.   This information is not intended to replace advice given to you by your health care provider. Make sure you discuss any questions you have with your health care provider.   Document Released: 08/10/2000 Document Revised: 07/01/2014 Document Reviewed: 05/22/2013 Elsevier Interactive Patient Education 2016 [employer]  Hemoptysis Hemoptysis, which  means coughing up blood, can be a sign of a minor problem or a serious medical condition. The blood that is coughed up may come from the lungs and airways. Coughed-up blood can also come from bleeding that occurs outside the lungs and airways. Blood can drain into the windpipe during a severe nosebleed or when blood is vomited from the stomach. Because hemoptysis can be a sign of something serious, a medical evaluation is required. For some people with hemoptysis, no definite cause is ever identified. CAUSES  The most common cause of hemoptysis is bronchitis. Some other common causes include:   A ruptured blood vessel caused by coughing or an infection.   A medical condition that causes damage to the large air passageways (bronchiectasis).   A blood clot in the lungs (pulmonary embolism).   Pneumonia.   Tuberculosis.   Breathing in a small foreign object.   Cancer. For some people with hemoptysis, no  definite cause is ever identified.  HOME CARE INSTRUCTIONS  Only take over-the-counter or prescription medicines as directed by your caregiver. Do not use cough suppressants unless your caregiver approves.  If your caregiver prescribes antibiotic medicines, take them as directed. Finish them even if you start to feel better.  Do not smoke. Also avoid secondhand smoke.  Follow up with your caregiver as directed. SEEK IMMEDIATE MEDICAL CARE IF:   You cough up bloody mucus for longer than a week.  You have a blood-producing cough that is severe or getting worse.  You have a blood-producing cough thatcomes and goes over time.  You develop problems with your breathing.   You vomit blood.  You develop bloody or black-colored stools.  You have chest pain.   You develop night sweats.  You feel faint or pass out.   You have a fever or persistent symptoms for more than 2-3 days.  You have a fever and your symptoms suddenly get worse. MAKE SURE YOU:  Understand these  instructions.  Will watch your condition.  Will get help right away if you are not doing well or get worse.   This information is not intended to replace advice given to you by your health care provider. Make sure you discuss any questions you have with your health care provider.   Document Released: 04/25/2001 Document Revised: 02/01/2012 Document Reviewed: 12/02/2011 Elsevier Interactive Patient Education [employer]

## 2014-12-25 NOTE — ED Provider Notes (Signed)
[facility] Emergency Department Provider Note  ____________________________________________  Time seen: Approximately 10:14 PM  I have reviewed the triage vital signs and the nursing notes.   HISTORY  Chief Complaint Hemoptysis    HPI Thomas Arroyo is a 39 y.o. male with no significant past medical history who presents with several days of upper respiratory symptoms including congestion, runny nose, nasal discharge, sore throat, fever, and productive cough.  Today he noticed that there was blood streaked within the sputum.  He has also had generalized myalgias.  He is not having any difficulty breathing other than the cough.  He denies nausea, vomiting, abdominal pain, dysuria.  He denies neck pain, confusion, and headache.  He has had no recent trips out of the country and no recent sick contacts.Overall he describes his symptoms as severe.   Past Medical History  Diagnosis Date  . Enlarged tonsils   . Hypertension     There are no active problems to display for this patient.   Past Surgical History  Procedure Laterality Date  . Knee surgery    . Foot surgery      Current Outpatient Rx  Name  Route  Sig  Dispense  Refill  . guaiFENesin (MUCINEX) 600 MG 12 hr tablet   Oral   Take 600 mg by mouth 2 (two) times daily.         Marland Kitchen ibuprofen (ADVIL,MOTRIN) 200 MG tablet   Oral   Take 200 mg by mouth every 6 (six) hours as needed.         Marland Kitchen azithromycin (ZITHROMAX) 250 MG tablet      Take 2 tablets PO on day 1, then take 1 tablet PO daily for 4 more days   4 each   0   . ondansetron (ZOFRAN) 4 MG tablet      Take 1-2 tabs by mouth every 8 hours as needed for nausea/vomiting   30 tablet   0     Allergies Bee venom and Oxycontin  No family history on file.  Social History Social History  Substance Use Topics  . Smoking status: Current Every Day Smoker -- 1.00 packs/day    Types: Cigarettes  . Smokeless tobacco: None  .  Alcohol Use: No    Review of Systems Constitutional: Fever, chills, and myalgias Eyes: No visual changes. ENT: Sore throat, worse with swallowing Cardiovascular: Denies chest pain. Respiratory: No shortness of breath but frequent productive cough with some streaky hemoptysis. Gastrointestinal: No abdominal pain.  No nausea, no vomiting.  No diarrhea.  No constipation. Genitourinary: Negative for dysuria. Musculoskeletal: Negative for back pain. Skin: Negative for rash. Neurological: Negative for headaches, focal weakness or numbness.  10-point ROS otherwise negative.  ____________________________________________   PHYSICAL EXAM:  VITAL SIGNS: ED Triage Vitals  Enc Vitals Group     BP 12/25/14 2124 131/81 mmHg     Pulse Rate 12/25/14 2124 103     Resp 12/25/14 2124 24     Temp 12/25/14 2124 98 F (36.7 C)     Temp Source 12/25/14 2124 Oral     SpO2 12/25/14 2124 97 %     Weight 12/25/14 2124 200 lb (90.719 kg)     Height 12/25/14 2124  (1.88 m)     Head Cir --      [id number]     Pain Loc --      Pain Edu? --  Excl. in GC? --     Constitutional: Alert and oriented.  Appears ill but nontoxic Eyes: Conjunctivae are injected. PERRL. EOMI. Head: Atraumatic. Nose: Congestion and nasal discharge Mouth/Throat: Mucous membranes are moist.  Oropharynx erythematous but with no exudate, petechiae on the palate, no evidence of abscess. Neck: No stridor.  No meningismus Hematological/Lymphatic/Immunilogical: Tender cervical lymphadenopathy bilaterally Cardiovascular: Borderline tachycardia, regular rhythm. Grossly normal heart sounds.  Good peripheral circulation. Respiratory: Normal respiratory effort.  No retractions. Lungs CTAB. Gastrointestinal: Soft and nontender. No distention. No abdominal bruits. No CVA tenderness. Musculoskeletal: No lower extremity tenderness nor edema.  No joint effusions. Neurologic:  Normal speech and  language. No gross focal neurologic deficits are appreciated.  Skin:  Skin is warm, dry and intact. No rash noted. Psychiatric: Mood and affect are normal. Speech and behavior are normal.  ____________________________________________   LABS (all labs ordered are listed, but only abnormal results are displayed)  Labs Reviewed  CBC WITH DIFFERENTIAL/PLATELET - Abnormal; Notable for the following:    WBC 11.9 (*)    Platelets 122 (*)    Neutro Abs 9.3 (*)    All other components within normal limits  COMPREHENSIVE METABOLIC PANEL - Abnormal; Notable for the following:    Glucose, Bld 118 (*)    All other components within normal limits  RAPID INFLUENZA A&B ANTIGENS ([facility] ONLY)   ____________________________________________  EKG  Not indicated ____________________________________________  RADIOLOGY Aura Camps, Kisean Rollo, personally viewed and evaluated these images (plain radiographs) as part of my medical decision making.   Dg Chest Portable 1 View  12/25/2014  CLINICAL DATA:  Acute onset of hemoptysis. Diaphoresis, chills, headache and fever. Initial encounter. EXAM: PORTABLE CHEST 1 VIEW COMPARISON:  Chest radiograph performed 03/10/2014 FINDINGS: The lungs are well-aerated and clear. There is no evidence of focal opacification, pleural effusion or pneumothorax. The cardiomediastinal silhouette is within normal limits. No acute osseous abnormalities are seen. IMPRESSION: No acute cardiopulmonary process seen. Electronically Signed   By: Roanna Raider M.D.   On: 12/25/2014 22:20    ____________________________________________   PROCEDURES  Procedure(s) performed: None  Critical Care performed: No ____________________________________________   INITIAL IMPRESSION / ASSESSMENT AND PLAN / ED COURSE  Pertinent labs & imaging results that were available during my care of the patient were reviewed by me and considered in my medical decision making (see chart for details).  The  patient is ill-appearing but nontoxic.  He has no signs of meningitis.  His chest x-ray is unremarkable but he is a tobacco user and is having some mild hemoptysis.  I will treat for possible atypical pneumonia/bronchitis with azithromycin, the first dose of which he is getting in the emergency department.  I given him 1 L of fluids for rehydration and he states that he is feeling better.  His influenza test in the emergency department was negative.  I explained to him and his wife about influenza-like illnesses, viral syndrome, and that he can expect to feel ill for several days but that he should improve.  I gave my usual and customary return precautions should he begin to decompensate instead of staying the same or improving.  I gave him a dose of Decadron 10 mg IV for his pharyngitis.  I made it clear that he should return if he is worried that he is getting worse instead of better and he understands and agrees with the plan.  ____________________________________________  FINAL CLINICAL IMPRESSION(S) / ED DIAGNOSES  Final diagnoses:  Influenza-like illness  NEW MEDICATIONS STARTED DURING THIS VISIT:  New Prescriptions   AZITHROMYCIN (ZITHROMAX) 250 MG TABLET    Take 2 tablets PO on day 1, then take 1 tablet PO daily for 4 more days   ONDANSETRON (ZOFRAN) 4 MG TABLET    Take 1-2 tabs by mouth every 8 hours as needed for nausea/vomiting     Loleta Roseory Starlett Pehrson, MD 12/26/14 0011

## 2014-12-25 NOTE — ED Notes (Signed)
MD York CeriseForbach verbal order to discontinue airborne isolation at this time.

## 2014-12-25 NOTE — ED Notes (Signed)
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 Pt reports sweating at night, chills, headache, fever as high as 101.35F Pt took motrin around 15:00 this afternoon pt talks in complete sentences no respiratory distress noted.

## 2014-12-25 NOTE — ED Notes (Addendum)
Pt states he started coughing up blood today, last night he had chills, sweating and fever. Symptoms started on Wednesday with "stuffy" nose.

## 2014-12-26 MED ORDER — AZITHROMYCIN 250 MG PO TABS
500.0000 mg | ORAL_TABLET | ORAL | Status: AC
  Administered 2014-12-26: 500 mg via ORAL
  Filled 2014-12-26: qty 2

## 2014-12-26 NOTE — ED Notes (Signed)
MD York CeriseForbach verbal order to discontinue droplet precautions.

## 2014-12-26 NOTE — ED Notes (Signed)
Patient with no complaints at this time. Respirations even and unlabored. Skin warm/dry. Discharge instructions reviewed with patient at this time. Patient given opportunity to voice concerns/ask questions. IV removed per policy and band-aid applied to site. Patient discharged at this time and left Emergency Department with steady gait, accompanied by spouse.   

## 2015-11-14 IMAGING — CR LEFT MIDDLE FINGER 2+V
1 series · 3 of 3 positions shown · non-contrast
Comparison: 01/05/2013

CLINICAL DATA: Increased swelling and pain at the level of recently
fractured left 3rd distal phalanx.

EXAM:
LEFT MIDDLE FINGER 2+V

[Series 1: pa · 0.17mm/px · 3 of 3 slices shown]
[im 1/3]
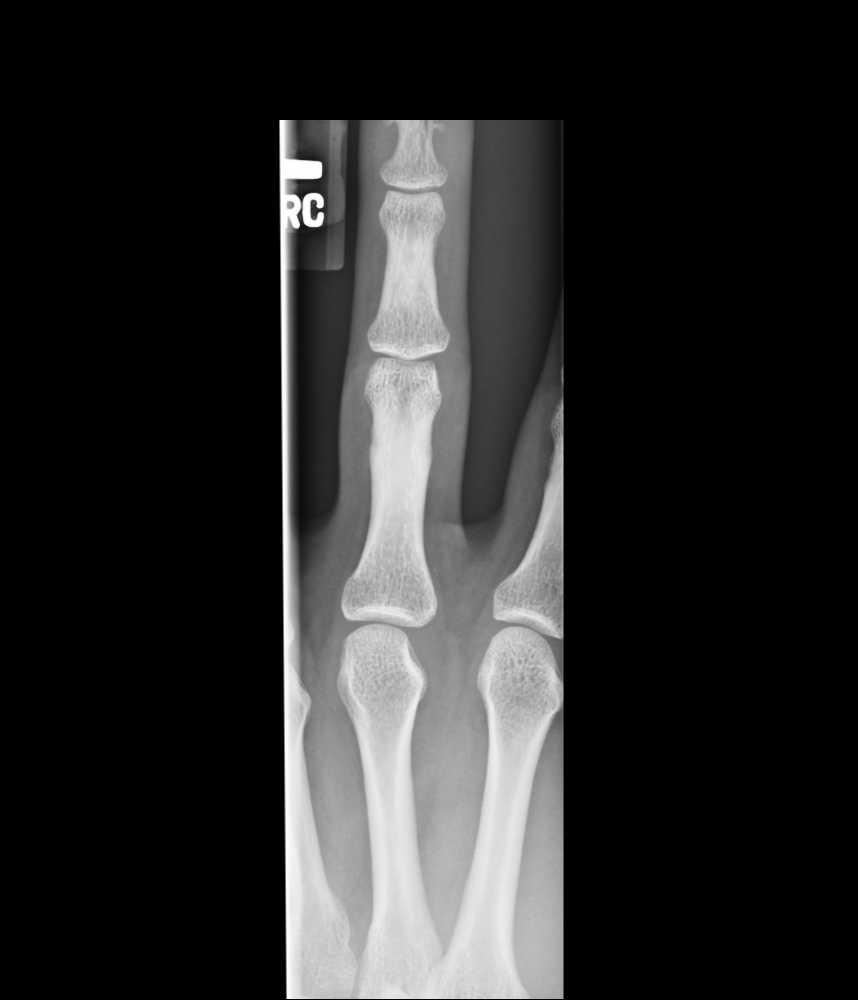
[im 2/3]
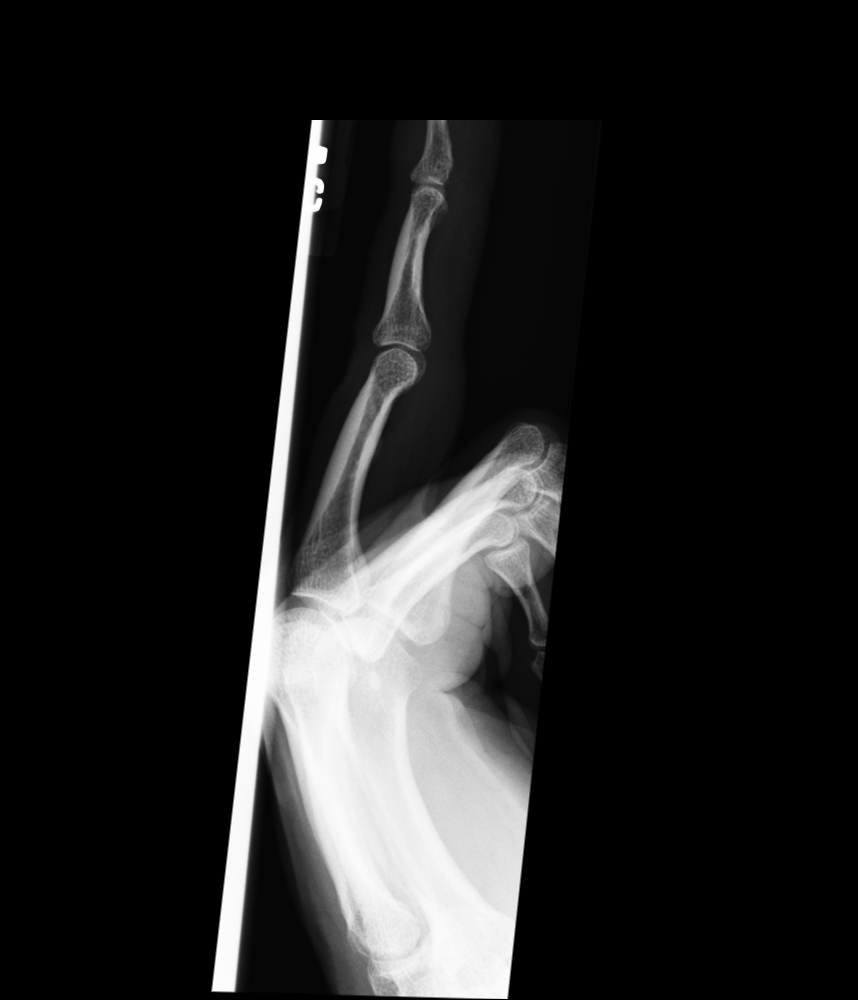
[im 3/3]
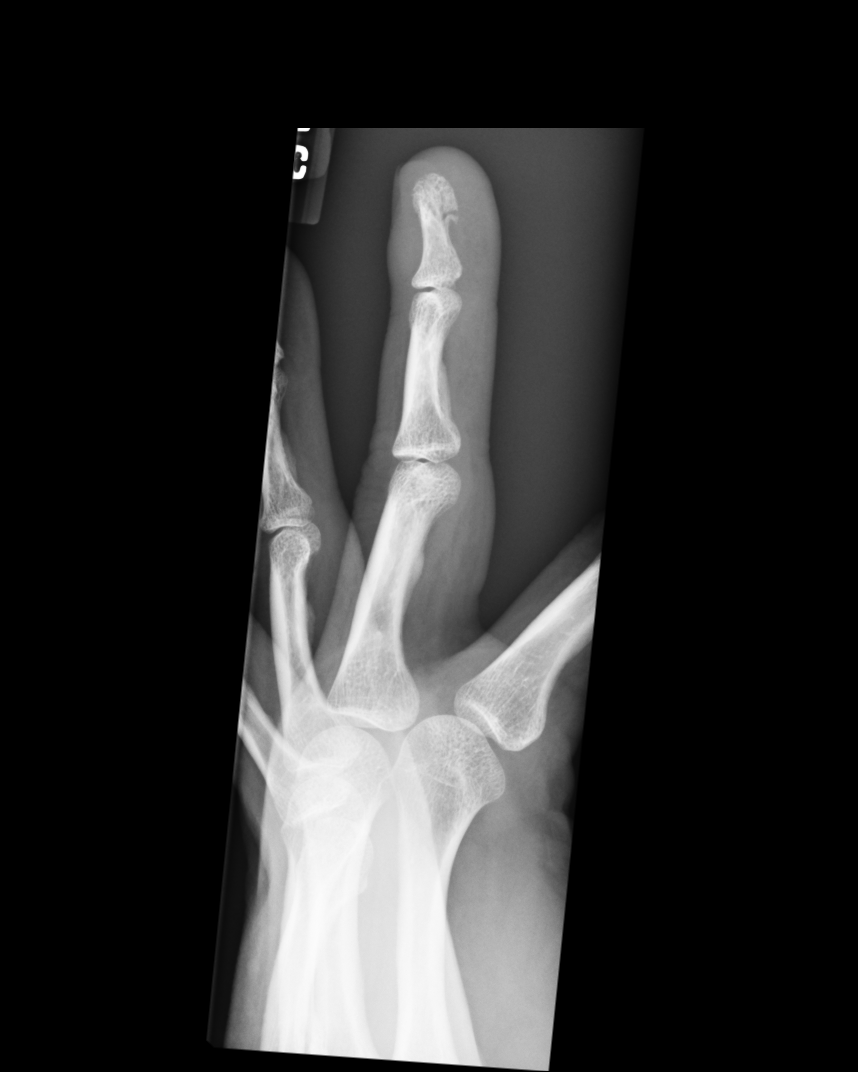

[3 of 3 positions shown; findings below may reference images not displayed]

FINDINGS: Fracture of the distal phalanx shows similar configuration to the
prior study with no evidence of significant interval healing. There
is suggestion of increased soft tissue swelling of the digit. No
bony destruction is identified. No foreign body is seen.
IMPRESSION: Stable appearance of fracture of left 3rd distal phalanx. There is
no radiographic evidence of significant interval healing. Increased
soft tissue swelling is identified.

## 2017-01-04 ENCOUNTER — Emergency Department
Admission: EM | Admit: 2017-01-04 | Discharge: 2017-01-04 | Disposition: A | Discharge status: Home / Self Care | Payer: Non-veteran care | Attending: Emergency Medicine | Admitting: Emergency Medicine

## 2017-01-04 ENCOUNTER — Emergency Department: Payer: Non-veteran care

## 2017-01-04 ENCOUNTER — Encounter: Payer: Self-pay | Admitting: Emergency Medicine

## 2017-01-04 DIAGNOSIS — R109 Unspecified abdominal pain: Secondary | ICD-10-CM

## 2017-01-04 DIAGNOSIS — Z79899 Other long term (current) drug therapy: Secondary | ICD-10-CM | POA: Diagnosis not present

## 2017-01-04 DIAGNOSIS — I10 Essential (primary) hypertension: Secondary | ICD-10-CM | POA: Diagnosis not present

## 2017-01-04 DIAGNOSIS — R55 Syncope and collapse: Secondary | ICD-10-CM | POA: Diagnosis not present

## 2017-01-04 DIAGNOSIS — Z87891 Personal history of nicotine dependence: Secondary | ICD-10-CM | POA: Insufficient documentation

## 2017-01-04 HISTORY — DX: Other specified diseases of intestine: K63.89

## 2017-01-04 HISTORY — DX: Noninfective gastroenteritis and colitis, unspecified: K52.9

## 2017-01-04 HISTORY — DX: Diverticulitis of intestine, part unspecified, without perforation or abscess without bleeding: K57.92

## 2017-01-04 LAB — BASIC METABOLIC PANEL
Anion gap: 9 (ref 5–15)
BUN: 11 mg/dL (ref 6–20)
CHLORIDE: 106 mmol/L (ref 101–111)
CO2: 23 mmol/L (ref 22–32)
CREATININE: 0.89 mg/dL (ref 0.61–1.24)
Calcium: 9.3 mg/dL (ref 8.9–10.3)
GFR calc non Af Amer: >60 mL/min (ref >60)
Glucose, Bld: 106 mg/dL — ABNORMAL HIGH (ref 65–99)
POTASSIUM: 3.2 mmol/L — AB (ref 3.5–5.1)
Sodium: 138 mmol/L (ref 135–145)

## 2017-01-04 LAB — HEPATIC FUNCTION PANEL
ALBUMIN: 4.2 g/dL (ref 3.5–5.0)
ALT: 49 U/L (ref 17–63)
AST: 29 U/L (ref 15–41)
Alkaline Phosphatase: 95 U/L (ref 38–126)
Bilirubin, Direct: <0.1 mg/dL — ABNORMAL LOW (ref 0.1–0.5)
TOTAL PROTEIN: 7.4 g/dL (ref 6.5–8.1)
Total Bilirubin: 0.6 mg/dL (ref 0.3–1.2)

## 2017-01-04 LAB — CBC
HEMATOCRIT: 45.2 % (ref 40.0–52.0)
HEMOGLOBIN: 15.1 g/dL (ref 13.0–18.0)
MCH: 29.7 pg (ref 26.0–34.0)
MCHC: 33.4 g/dL (ref 32.0–36.0)
MCV: 89.1 fL (ref 80.0–100.0)
PLATELETS: 142 10*3/uL — AB (ref 150–440)
RBC: 5.07 MIL/uL (ref 4.40–5.90)
RDW: 13.4 % (ref 11.5–14.5)
WBC: 6.5 10*3/uL (ref 3.8–10.6)

## 2017-01-04 LAB — URINALYSIS, COMPLETE (UACMP) WITH MICROSCOPIC
BACTERIA UA: NONE SEEN
BILIRUBIN URINE: NEGATIVE
Glucose, UA: NEGATIVE mg/dL
KETONES UR: NEGATIVE mg/dL
Leukocytes, UA: NEGATIVE
Nitrite: NEGATIVE
PROTEIN: NEGATIVE mg/dL
SQUAMOUS EPITHELIAL / LPF: NONE SEEN
Specific Gravity, Urine: >1.046 — ABNORMAL HIGH (ref 1.005–1.030)
pH: 5 (ref 5.0–8.0)

## 2017-01-04 LAB — GLUCOSE, CAPILLARY: GLUCOSE-CAPILLARY: 92 mg/dL (ref 65–99)

## 2017-01-04 LAB — LIPASE, BLOOD: LIPASE: 21 U/L (ref 11–51)

## 2017-01-04 MED ORDER — SODIUM CHLORIDE 0.9 % IV BOLUS (SEPSIS)
500.0000 mL | Freq: Once | INTRAVENOUS | Status: AC
Start: 2017-01-04 — End: 2017-01-04
  Administered 2017-01-04: 500 mL via INTRAVENOUS

## 2017-01-04 MED ORDER — ONDANSETRON HCL 4 MG/2ML IJ SOLN
4.0000 mg | Freq: Once | INTRAMUSCULAR | Status: AC
  Administered 2017-01-04: 4 mg via INTRAVENOUS
  Filled 2017-01-04: qty 2

## 2017-01-04 MED ORDER — ALUM & MAG HYDROXIDE-SIMETH 400-400-40 MG/5ML PO SUSP: 5.0000 mL | Freq: Four times a day (QID) | ORAL | 0 refills | Status: DC | PRN

## 2017-01-04 MED ORDER — IOPAMIDOL (ISOVUE-300) INJECTION 61%
100.0000 mL | Freq: Once | INTRAVENOUS | Status: AC | PRN
  Administered 2017-01-04: 100 mL via INTRAVENOUS
  Filled 2017-01-04: qty 100

## 2017-01-04 MED ORDER — FAMOTIDINE 20 MG PO TABS: 20.0000 mg | ORAL_TABLET | Freq: Every day | ORAL | 1 refills | Status: DC

## 2017-01-04 MED ORDER — MORPHINE SULFATE (PF) 4 MG/ML IV SOLN
4.0000 mg | Freq: Once | INTRAVENOUS | Status: AC
  Administered 2017-01-04: 4 mg via INTRAVENOUS
  Filled 2017-01-04: qty 1

## 2017-01-04 NOTE — Discharge Instructions (Signed)

## 2017-01-04 NOTE — ED Triage Notes (Signed)
Patient presents to the ED with constant chronic abdominal pain and a near syncopal episode today.  Patient states today at work he became dizzy and felt like he was going to pass out.

## 2017-01-04 NOTE — ED Notes (Signed)
Pt attempting urine sample 

## 2017-01-04 NOTE — ED Provider Notes (Signed)
[facility] Emergency Department Provider Note  ____________________________________________  Time seen: Approximately 8:23 PM  I have reviewed the triage vital signs and the nursing notes.   HISTORY  Chief Complaint Near Syncope and Abdominal Pain   HPI Thomas Arroyo is a 41 y.o. male with a history of hypertension, diverticulitis and epiploic appendagitis who presents for evaluation of abdominal pain and a near syncopal episode. Patient reports that for the last 2 weeks he has had constant left-sided abdominal pain that he describes as sharp and nonradiating. Currently 8 out of 10. When he was at work today he said the pain became severe which made him very nauseous and diaphoretic and patient felt like he was going to pass out. Patient never fully lost consciousness. Patient reports that he has been having abdominal pain for a month. Initially on the right side of his abdomen for which she was seen at the [facility] and diagnosed with epiploic appendagitis. This current pain on the left side has been going on for 2 weeks now. No chest pain, shortness of breath, palpitations, fever, chills, vomiting, diarrhea or constipation. No prior abdominal surgeries.  Past Medical History:  Diagnosis Date  . Diverticulitis   . Enlarged tonsils   . Epiploic appendagitis   . Hypertension     There are no active problems to display for this patient.   Past Surgical History:  Procedure Laterality Date  . FOOT SURGERY      Prior to Admission medications   Medication Sig Start Date End Date Taking? Authorizing Provider  alum & mag hydroxide-simeth (MAALOX MAX) 400-400-40 MG/5ML suspension Take 5 mLs every 6 (six) hours as needed by mouth for indigestion. 01/04/17   Nita SickleVeronese, [place], MD  azithromycin (ZITHROMAX) 250 MG tablet Take 2 tablets PO on day 1, then take 1 tablet PO daily for 4 more days 12/26/14   Loleta RoseForbach, Cory, MD  famotidine (PEPCID) 20 MG tablet Take 1 tablet  (20 mg total) daily by mouth. 01/04/17 01/04/18  Nita SickleVeronese, [place], MD  guaiFENesin (MUCINEX) 600 MG 12 hr tablet Take 600 mg by mouth 2 (two) times daily.    [provider]  ibuprofen (ADVIL,MOTRIN) 200 MG tablet Take 200 mg by mouth every 6 (six) hours as needed.    [provider]  ondansetron (ZOFRAN) 4 MG tablet Take 1-2 tabs by mouth every 8 hours as needed for nausea/vomiting 12/25/14   Loleta RoseForbach, Cory, MD    Allergies Bee venom and Oxycontin [oxycodone hcl]  No family history on file.  Social History Social History   Tobacco Use  . Smoking status: Former Smoker    Packs/day: 1.00    Types: Cigarettes    Last attempt to quit: 12/21/2016    Years since quitting: 0.0  . Smokeless tobacco: Never Used  Substance Use Topics  . Alcohol use: Yes    Comment: rarely  . Drug use: No    Review of Systems  Constitutional: Negative for fever. + near syncope Eyes: Negative for visual changes. ENT: Negative for sore throat. Neck: No neck pain  Cardiovascular: Negative for chest pain. Respiratory: Negative for shortness of breath. Gastrointestinal: + Left sided abdominal pain and nausea. No vomiting or diarrhea. Genitourinary: Negative for dysuria. Musculoskeletal: Negative for back pain. Skin: Negative for rash. Neurological: Negative for headaches, weakness or numbness. Psych: No SI or HI  ____________________________________________   PHYSICAL EXAM:  VITAL SIGNS: ED Triage Vitals  Enc Vitals Group     BP 01/04/17 1729  126/75     Pulse Rate 01/04/17 1729 88     Resp 01/04/17 1729 18     Temp 01/04/17 1729 97.9 F (36.6 C)     Temp Source 01/04/17 1729 Oral     SpO2 01/04/17 1729 100 %     Weight 01/04/17 1729 220 lb (99.8 kg)     Height 01/04/17 1729 6\' 2"  (1.88 m)     Head Circumference --      Peak Flow --      Pain Score 01/04/17 1728 8     Pain Loc --      Pain Edu? --      Excl. in GC? --     Constitutional: Alert and oriented. Well  appearing and in no apparent distress. HEENT:      Head: Normocephalic and atraumatic.         Eyes: Conjunctivae are normal. Sclera is non-icteric.       Mouth/Throat: Mucous membranes are moist.       Neck: Supple with no signs of meningismus. Cardiovascular: Regular rate and rhythm. No murmurs, gallops, or rubs. 2+ symmetrical distal pulses are present in all extremities. No JVD. Respiratory: Normal respiratory effort. Lungs are clear to auscultation bilaterally. No wheezes, crackles, or rhonchi.  Gastrointestinal: Soft, diffusely ttp worse in the left quadrants, and non distended with positive bowel sounds. No rebound or guarding. Genitourinary: No CVA tenderness. Musculoskeletal: Nontender with normal range of motion in all extremities. No edema, cyanosis, or erythema of extremities. Neurologic: Normal speech and language. Face is symmetric. Moving all extremities. No gross focal neurologic deficits are appreciated. Skin: Skin is warm, dry and intact. No rash noted. Psychiatric: Mood and affect are normal. Speech and behavior are normal.  ____________________________________________   LABS (all labs ordered are listed, but only abnormal results are displayed)  Labs Reviewed  BASIC METABOLIC PANEL - Abnormal; Notable for the following components:      Result Value   Potassium 3.2 (*)    Glucose, Bld 106 (*)    All other components within normal limits  CBC - Abnormal; Notable for the following components:   Platelets 142 (*)    All other components within normal limits  URINALYSIS, COMPLETE (UACMP) WITH MICROSCOPIC - Abnormal; Notable for the following components:   Color, Urine YELLOW (*)    APPearance CLEAR (*)    Specific Gravity, Urine >1.046 (*)    Hgb urine dipstick SMALL (*)    All other components within normal limits  HEPATIC FUNCTION PANEL - Abnormal; Notable for the following components:   Bilirubin, Direct <0.1 (*)    All other components within normal limits    GLUCOSE, CAPILLARY  LIPASE, BLOOD  CBG MONITORING, ED   ____________________________________________  EKG  ED ECG REPORT I, [name], the attending physician, personally viewed and interpreted this ECG.  Normal sinus  rhythm, normal intervals, normal axis, no STE or depressions, no evidence of HOCM, AV block, delta wave, ARVD, prolonged QTc, WPW, or Brugada. ____________________________________________  RADIOLOGY  CT a/p: negative  ____________________________________________   PROCEDURES  Procedure(s) performed: None Procedures Critical Care performed:  None ____________________________________________   INITIAL IMPRESSION / ASSESSMENT AND PLAN / ED COURSE  41 y.o. male with a history of hypertension, diverticulitis and epiploic appendagitis who presents for evaluation of 2 weeks of left sided abdominal pain and a near syncopal episode while at work today when the pain became severe. Patient is well-appearing, in no distress, has normal vital signs,  abdomen is soft with diffuse mild tenderness to palpation worse in the left quadrants, no rebound or guarding. EKG with no ischemic changes or no evidence of dysrhythmias. Labs including CBC, BMP, lipase, LFTs negative. US pending. Ddx diverticulitis vs colitis vs kidney stone. Will give morphine and zofran and send patient for CT a/p.    _________________________ 10:01 PM on 01/04/2017 -----------------------------------------  CT with no acute findings. Patient reports that he has an appointment scheduled with a GI through the [facility] next week for an endoscopy. We'll start patient on Pepcid daily and Maalox when necessary. Discussed return precautions and recommended close follow-up with primary care doctor.   As part of my medical decision making, I reviewed the following data within the electronic MEDICAL RECORD NUMBER History obtained from family, Nursing notes reviewed and incorporated, Labs reviewed , EKG interpreted ,  Radiograph reviewed , Notes from prior ED visits and [place] Controlled Substance Database    Pertinent labs & imaging results that were available during my care of the patient were reviewed by me and considered in my medical decision making (see chart for details).    ____________________________________________   FINAL CLINICAL IMPRESSION(S) / ED DIAGNOSES  Final diagnoses:  Abdominal pain, unspecified abdominal location  Near syncope      NEW MEDICATIONS STARTED DURING THIS VISIT:  This SmartLink is deprecated. Use AVSMEDLIST instead to display the medication list for a patient.   Note:  This document was prepared using Dragon voice recognition software and may include unintentional dictation errors.    Nita SickleVeronese, [place], MD 01/04/17 2202

## 2017-01-04 NOTE — ED Notes (Signed)
Patient transported to CT 

## 2017-04-13 ENCOUNTER — Telehealth: Payer: Self-pay | Admitting: Gastroenterology

## 2017-04-13 NOTE — Telephone Encounter (Signed)
Patient called back please call him before 2 pm as that is when he goes into work.

## 2017-04-25 ENCOUNTER — Encounter: Payer: Self-pay | Admitting: Gastroenterology

## 2017-04-25 ENCOUNTER — Other Ambulatory Visit: Payer: Self-pay

## 2017-04-25 ENCOUNTER — Ambulatory Visit (INDEPENDENT_AMBULATORY_CARE_PROVIDER_SITE_OTHER): Payer: Non-veteran care | Admitting: Gastroenterology

## 2017-04-25 ENCOUNTER — Encounter (INDEPENDENT_AMBULATORY_CARE_PROVIDER_SITE_OTHER): Payer: Self-pay

## 2017-04-25 VITALS — BP 130/85 | HR 76 | Temp 98.2°F | Ht 74.0 in | Wt 245.8 lb

## 2017-04-25 DIAGNOSIS — R194 Change in bowel habit: Secondary | ICD-10-CM

## 2017-04-25 DIAGNOSIS — R1032 Left lower quadrant pain: Secondary | ICD-10-CM

## 2017-04-25 NOTE — Progress Notes (Signed)
[name], MD [address]  [address]  [place], [place] [address]  Main: [phone]  Fax: [phone]    Gastroenterology Consultation  Referring Provider:     [facility] Primary Care Physician:  Default, Provider, MD Primary Gastroenterologist:  Dr. [name] Reason for Consultation:     Left-sided abdominal pain        HPI:   Thomas Arroyo is a 42 y.o. male referred by Dr. Hoy Finlay, Provider, MD  for consultation & management of left-sided abdominal pain. He reports approximately 1 year history of left-sided abdominal pain, sharp in nature which usually starts in left lower quadrant, migrates to left upper quadrant and feels like it makes a circular movement going back to lower abdomen. He reports having 3-4, nonbloody bowel movements for more than a year, semi-formed to hard and notices blood on wiping. He reports standing or bending over makes the pain worse, lying down makes the pain better. He denies any relation to food. He denies weight loss, nausea, vomiting. 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 He was in the emergency room at [facility] due to abdominal pain in 12/2016 and underwent CT A/P with contrast which revealed nonobstructive left renal calculus. He had normal CBC, CMP. He reports lifting more than 58 pounds at his job daily, as he works in a Magazine features editor place. He denies any other symptoms otherwise. He has been taking ibuprofen 600 mg daily long-term for chronic migraine headaches  NSAIDs: ibuprofen 6 mg daily  Antiplts/Anticoagulants/Anti thrombotics: none  GI Procedures: none No known family history of GI malignancy or inflammatory bowel disease No prior gastrointestinal surgeries  Past Medical History:  Diagnosis Date  . Diverticulitis   . Enlarged tonsils   . Epiploic appendagitis   . Hypertension     Past Surgical History:  Procedure Laterality Date  . FOOT SURGERY        Current Outpatient Medications:  .  ibuprofen (ADVIL,MOTRIN) 200 MG tablet, Take 200 mg by mouth every 6 (six) hours as needed., Disp: , Rfl:  .  alum & mag hydroxide-simeth (MAALOX MAX) 400-400-40 MG/5ML suspension, Take 5 mLs every 6 (six) hours as needed by mouth for indigestion. (Patient not taking: Reported on 04/25/2017), Disp: 355 mL, Rfl: 0 .  azithromycin (ZITHROMAX) 250 MG tablet, Take 2 tablets PO on day 1, then take 1 tablet PO daily for 4 more days (Patient not taking: Reported on 04/25/2017), Disp: 4 each, Rfl: 0 .  famotidine (PEPCID) 20 MG tablet, Take 1 tablet (20 mg total) daily by mouth. (Patient not taking: Reported on 04/25/2017), Disp: 30 tablet, Rfl: 1 .  guaiFENesin (MUCINEX) 600 MG 12 hr tablet, Take 600 mg by mouth 2 (two) times daily., Disp: , Rfl:  .  ondansetron (ZOFRAN) 4 MG tablet, Take 1-2 tabs by mouth every 8 hours as needed for nausea/vomiting (Patient not taking: Reported on 04/25/2017), Disp: 30 tablet, Rfl: 0   History reviewed. No pertinent family history.   Social History   Tobacco Use  . Smoking status: Former Smoker    Packs/day: 1.00    Types: Cigarettes    Last attempt to quit: 12/21/2016    Years since quitting: 0.3  . Smokeless tobacco: Never Used  Substance Use Topics  . Alcohol use: Yes    Comment: rarely  . Drug use: No    Allergies as of 04/25/2017 - Review Complete 04/25/2017  Allergen Reaction Noted  .  Bee venom Anaphylaxis 03/03/2012  . Oxycontin [oxycodone hcl] Other (See Comments) 03/03/2012    Review of Systems:    All systems reviewed and negative except where noted in HPI.   Physical Exam:  BP 130/85   Pulse 76   Temp 98.2 F (36.8 C) (Oral)   Ht 6\' 2"  (1.88 m)   Wt 245 lb 12.8 oz (111.5 kg)   BMI 31.56 kg/m  No LMP for male patient.  General:   Alert,  Well-developed, well-nourished, pleasant and cooperative in NAD Head:  Normocephalic and atraumatic. Eyes:  Sclera clear, no icterus.   Conjunctiva  pink. Ears:  Normal auditory acuity. Nose:  No deformity, discharge, or lesions. Mouth:  No deformity or lesions,oropharynx pink & moist. Neck:  Supple; no masses or thyromegaly. Lungs:  Respirations even and unlabored.  Clear throughout to auscultation.   No wheezes, crackles, or rhonchi. No acute distress. Heart:  Regular rate and rhythm; no murmurs, clicks, rubs, or gallops. Abdomen:  Normal bowel sounds. Soft,  Mild tenderness in left-sided abdomen and non-distended without masses, hepatosplenomegaly or hernias noted.  No guarding or rebound tenderness.   Rectal: Not performed Msk:  Symmetrical without gross deformities. Good, equal movement & strength bilaterally. Pulses:  Normal pulses noted. Extremities:  No clubbing or edema.  No cyanosis. Neurologic:  Alert and oriented x3;  grossly normal neurologically. Skin:  Intact without significant lesions or rashes. No jaundice. Psych:  Alert and cooperative. Normal mood and affect.  Imaging Studies: reviewed  Assessment and Plan:   Thomas Arroyo is a 42 y.o. male veteran with no significant past medical history presents with approximately 1 year history of left-sided abdominal pain and increased bowel frequency of variable consistency and blood on wiping. Differentials include recurrent diverticulitis or irritable bowel syndrome or inflammatory bowel disease or NSAID-induced colitis or less likely malignancy. Given chronicity of symptoms, I recommend colonoscopy for further evaluation. I encouraged him to avoid NSAIDs, try acetaminophen 650 mg daily for headache  I have discussed alternative options, risks & benefits,  which include, but are not limited to, bleeding, infection, perforation,respiratory complication & drug reaction.  The patient agrees with this plan & written consent will be obtained.     Follow up in 2 months   Arlyss Repressohini R Sariah Henkin, MD

## 2017-05-11 ENCOUNTER — Ambulatory Visit: Payer: Non-veteran care | Admitting: Certified Registered Nurse Anesthetist

## 2017-05-11 ENCOUNTER — Ambulatory Visit
Admission: RE | Admit: 2017-05-11 | Discharge: 2017-05-11 | Disposition: A | Discharge status: Home / Self Care | Payer: Non-veteran care | Source: Ambulatory Visit | Attending: Gastroenterology | Admitting: Gastroenterology

## 2017-05-11 ENCOUNTER — Other Ambulatory Visit: Payer: Self-pay

## 2017-05-11 ENCOUNTER — Encounter: Payer: Self-pay | Admitting: *Deleted

## 2017-05-11 ENCOUNTER — Encounter
Admission: RE | Disposition: A | Discharge status: Home / Self Care | Payer: Self-pay | Source: Ambulatory Visit | Attending: Gastroenterology

## 2017-05-11 DIAGNOSIS — R194 Change in bowel habit: Secondary | ICD-10-CM | POA: Diagnosis not present

## 2017-05-11 DIAGNOSIS — K625 Hemorrhage of anus and rectum: Secondary | ICD-10-CM | POA: Diagnosis not present

## 2017-05-11 DIAGNOSIS — Z87891 Personal history of nicotine dependence: Secondary | ICD-10-CM | POA: Insufficient documentation

## 2017-05-11 DIAGNOSIS — R1032 Left lower quadrant pain: Secondary | ICD-10-CM | POA: Diagnosis present

## 2017-05-11 DIAGNOSIS — Z91030 Bee allergy status: Secondary | ICD-10-CM | POA: Insufficient documentation

## 2017-05-11 DIAGNOSIS — Z885 Allergy status to narcotic agent status: Secondary | ICD-10-CM | POA: Insufficient documentation

## 2017-05-11 DIAGNOSIS — I10 Essential (primary) hypertension: Secondary | ICD-10-CM | POA: Insufficient documentation

## 2017-05-11 HISTORY — PX: COLONOSCOPY WITH PROPOFOL: SHX5780

## 2017-05-11 SURGERY — COLONOSCOPY WITH PROPOFOL
Anesthesia: General

## 2017-05-11 MED ORDER — SODIUM CHLORIDE 0.9 % IV SOLN
INTRAVENOUS | Status: DC
  Administered 2017-05-11: 08:00:00 via INTRAVENOUS

## 2017-05-11 MED ORDER — PROPOFOL 500 MG/50ML IV EMUL
INTRAVENOUS | Status: AC
  Filled 2017-05-11: qty 50

## 2017-05-11 MED ORDER — PROPOFOL 10 MG/ML IV BOLUS
INTRAVENOUS | Status: DC | PRN
  Administered 2017-05-11: 70 mg via INTRAVENOUS
  Administered 2017-05-11: 100 mg via INTRAVENOUS

## 2017-05-11 MED ORDER — PROPOFOL 500 MG/50ML IV EMUL
INTRAVENOUS | Status: DC | PRN
  Administered 2017-05-11: 160 ug/kg/min via INTRAVENOUS

## 2017-05-11 NOTE — Anesthesia Preprocedure Evaluation (Signed)
Anesthesia Evaluation  Patient identified by MRN, date of birth, ID band Patient awake    Reviewed: Allergy & Precautions, H&P , NPO status , Patient's Chart, lab work & pertinent test results, reviewed documented beta blocker date and time   History of Anesthesia Complications Negative for: history of anesthetic complications  Airway Mallampati: II  TM Distance: >3 FB Neck ROM: full    Dental  (+) Missing, Dental Advidsory Given, Chipped   Pulmonary neg shortness of breath, sleep apnea , neg COPD, neg recent URI, former smoker,           Cardiovascular Exercise Tolerance: Good hypertension, (-) angina(-) CAD, (-) Past MI, (-) Cardiac Stents and (-) CABG (-) dysrhythmias (-) Valvular Problems/Murmurs     Neuro/Psych negative neurological ROS  negative psych ROS   GI/Hepatic negative GI ROS, Neg liver ROS,   Endo/Other  negative endocrine ROS  Renal/GU negative Renal ROS  negative genitourinary   Musculoskeletal   Abdominal   Peds  Hematology negative hematology ROS (+)   Anesthesia Other Findings Past Medical History: No date: Diverticulitis No date: Enlarged tonsils No date: Epiploic appendagitis No date: Hypertension   Reproductive/Obstetrics negative OB ROS                             Anesthesia Physical Anesthesia Plan  ASA: II  Anesthesia Plan: General   Post-op Pain Management:    Induction: Intravenous  PONV Risk Score and Plan: 2 and Propofol infusion  Airway Management Planned: Natural Airway and Nasal Cannula  Additional Equipment:   Intra-op Plan:   Post-operative Plan:   Informed Consent: I have reviewed the patients History and Physical, chart, labs and discussed the procedure including the risks, benefits and alternatives for the proposed anesthesia with the patient or authorized representative who has indicated his/her understanding and acceptance.    Dental Advisory Given  Plan Discussed with: Anesthesiologist, CRNA and Surgeon  Anesthesia Plan Comments:         Anesthesia Quick Evaluation

## 2017-05-11 NOTE — H&P (Signed)
Thomas Repress, MD [address]  [address]  [place], [place] [address]  Main: [phone]  Fax: [phone] Pager: [phone]  Primary Care Physician:  Default, Provider, MD Primary Gastroenterologist:  Dr. Arlyss Arroyo  Pre-Procedure History & Physical: HPI:  Thomas Arroyo is a 42 y.o. male is here for an colonoscopy.   Past Medical History:  Diagnosis Date  . Diverticulitis   . Enlarged tonsils   . Epiploic appendagitis   . Hypertension     Past Surgical History:  Procedure Laterality Date  . FOOT SURGERY      Prior to Admission medications   Medication Sig Start Date End Date Taking? Authorizing Provider  guaiFENesin (MUCINEX) 600 MG 12 hr tablet Take 600 mg by mouth 2 (two) times daily.   Yes [provider]  ibuprofen (ADVIL,MOTRIN) 200 MG tablet Take 200 mg by mouth every 6 (six) hours as needed.   Yes [provider]  alum & mag hydroxide-simeth (MAALOX MAX) 400-400-40 MG/5ML suspension Take 5 mLs every 6 (six) hours as needed by mouth for indigestion. Patient not taking: Reported on 04/25/2017 01/04/17   [name], MD  azithromycin [facility]) 250 MG tablet Take 2 tablets PO on day 1, then take 1 tablet PO daily for 4 more days Patient not taking: Reported on 04/25/2017 12/26/14   [name], MD  famotidine (PEPCID) 20 MG tablet Take 1 tablet (20 mg total) daily by mouth. Patient not taking: Reported on 04/25/2017 01/04/17 01/04/18  [name], MD  ondansetron [facility]) 4 MG tablet Take 1-2 tabs by mouth every 8 hours as needed for nausea/vomiting Patient not taking: Reported on 04/25/2017 12/25/14   [name], MD    Allergies as of 04/25/2017 - Review Complete 04/25/2017  Allergen Reaction Noted  . Bee venom Anaphylaxis 03/03/2012  . Oxycontin [oxycodone hcl] Other (See Comments) 03/03/2012    History reviewed. No pertinent family history.  Social History   Socioeconomic History  . Marital status: Single    Spouse name: Not on file  . Number of children: Not on file  . Years of education: Not on file  . Highest education level: Not on file  Social Needs  . Financial resource strain: Not on file  . Food insecurity - worry: Not on file  . Food insecurity - inability: Not on file  . Transportation needs - medical: Not on file  . Transportation needs - non-medical: Not on file  Occupational History  . Not on file  Tobacco Use  . Smoking status: Former Smoker    Packs/day: 1.00    Types: Cigarettes    Last attempt to quit: 12/21/2016    Years since quitting: 0.3  . Smokeless tobacco: Never Used  Substance and Sexual Activity  . Alcohol use: Yes    Comment: rarely  . Drug use: No  . Sexual activity: Not on file  Other Topics Concern  . Not on file  Social History Narrative  . Not on file    Review of Systems: See HPI, otherwise negative ROS  Physical Exam: BP (!) 134/103   Pulse 74   Temp (!) 97.1 F (36.2 C) (Tympanic)   Resp 16   Ht 6\' 2"  (1.88 m)   Wt 240 lb (108.9 kg)   SpO2 100%   BMI 30.81 kg/m  General:   Alert,  pleasant and cooperative in NAD Head:  Normocephalic and atraumatic. Neck:  Supple; no masses or thyromegaly. Lungs:  Clear throughout to auscultation.  Heart:  Regular rate and rhythm. Abdomen:  Soft, nontender and nondistended. Normal bowel sounds, without guarding, and without rebound.   Neurologic:  Alert and  oriented x4;  grossly normal neurologically.  Impression/Plan: Thomas Arroyo is here for an colonoscopy to be performed for LLQ pain, altered bowel habits and rectal bleeding  Risks, benefits, limitations, and alternatives regarding  colonoscopy have been reviewed with the patient.  Questions have been answered.  All parties agreeable.   Lannette Donathohini Samyra Limb, MD  05/11/2017, 8:15 AM

## 2017-05-11 NOTE — Op Note (Signed)
[facility] Gastroenterology Patient Name: Thomas Arroyo Procedure Date: 05/11/2017 7:55 AM MRN: [id number] Account #: [id number] Date of Birth: 1976-02-05 Admit Type: Outpatient Age: 42 Room: [facility] ENDO ROOM 2 Gender: Male Note Status: Finalized Procedure:            Colonoscopy Indications:          Abdominal pain in the left lower quadrant, Rectal                        bleeding Providers:            Lin Landsman MD, MD Referring MD:         [facility], MD (Referring MD) Medicines:            Monitored Anesthesia Care Complications:        No immediate complications. Estimated blood loss: None. Procedure:            Pre-Anesthesia Assessment:                       - Prior to the procedure, a History and Physical was                        performed, and patient medications and allergies were                        reviewed. The patient is competent. The risks and                        benefits of the procedure and the sedation options and                        risks were discussed with the patient. All questions                        were answered and informed consent was obtained.                        Patient identification and proposed procedure were                        verified by the physician, the nurse, the                        anesthesiologist, the anesthetist and the technician in                        the pre-procedure area in the procedure room in the                        endoscopy suite. Mental Status Examination: alert and                        oriented. Airway Examination: normal oropharyngeal                        airway and neck mobility. Respiratory Examination:                        clear to auscultation. CV Examination: normal.  Prophylactic Antibiotics: The patient does not require                        prophylactic antibiotics. Prior Anticoagulants: The                        patient has taken  no previous anticoagulant or                        antiplatelet agents. ASA Grade Assessment: II - A                        patient with mild systemic disease. After reviewing the                        risks and benefits, the patient was deemed in                        satisfactory condition to undergo the procedure. The                        anesthesia plan was to use monitored anesthesia care                        (MAC). Immediately prior to administration of                        medications, the patient was re-assessed for adequacy                        to receive sedatives. The heart rate, respiratory rate,                        oxygen saturations, blood pressure, adequacy of                        pulmonary ventilation, and response to care were                        monitored throughout the procedure. The physical status                        of the patient was re-assessed after the procedure.                       After obtaining informed consent, the colonoscope was                        passed under direct vision. Throughout the procedure,                        the patient's blood pressure, pulse, and oxygen                        saturations were monitored continuously. The                        Colonoscope was introduced through the anus and  advanced to the the cecum, identified by appendiceal                        orifice and ileocecal valve. The colonoscopy was                        performed without difficulty. The patient tolerated the                        procedure well. The quality of the bowel preparation                        was evaluated using the BBPS [facility] Bowel Preparation                        Scale) with scores of: Right Colon = 3, Transverse                        Colon = 3 and Left Colon = 3 (entire mucosa seen well                        with no residual staining, small fragments of stool or                         opaque liquid). The total BBPS score equals 9. Findings:      The perianal and digital rectal examinations were normal. Pertinent       negatives include normal sphincter tone and no palpable rectal lesions.      The colon (entire examined portion) appeared normal.      The retroflexed view of the distal rectum and anal verge was normal and       showed no anal or rectal abnormalities. Impression:           - The entire examined colon is normal.                       - The distal rectum and anal verge are normal on                        retroflexion view.                       - No specimens collected. Recommendation:       - Discharge patient to home.                       - Resume previous diet today.                       - Continue present medications.                       - Repeat colonoscopy at age 43 for surveillance.                       - Return to my office PRN. Procedure Code(s):    --- Professional ---                       [phone], Colonoscopy, flexible; diagnostic, including  collection of specimen(s) by brushing or washing, when                        performed (separate procedure) Diagnosis Code(s):    --- Professional ---                       R10.32, Left lower quadrant pain                       K62.5, Hemorrhage of anus and rectum CPT copyright 2016 American Medical Association. All rights reserved. The codes documented in this report are preliminary and upon coder review may  be revised to meet current compliance requirements. Dr. Ulyess Mort Lin Landsman MD, MD 05/11/2017 8:41:46 AM This report has been signed electronically. Number of Addenda: 0 Note Initiated On: 05/11/2017 7:55 AM Scope Withdrawal Time: 0 hours 6 minutes 27 seconds  Total Procedure Duration: 0 hours 9 minutes 39 seconds       [facility]

## 2017-05-11 NOTE — Anesthesia Procedure Notes (Signed)
Performed by: Wilburta Milbourn, CRNA Pre-anesthesia Checklist: Patient identified, Emergency Drugs available, Suction available, Patient being monitored and Timeout performed Patient Re-evaluated:Patient Re-evaluated prior to induction Oxygen Delivery Method: Nasal cannula Induction Type: IV induction       

## 2017-05-11 NOTE — Anesthesia Post-op Follow-up Note (Signed)
Anesthesia QCDR form completed.        

## 2017-05-11 NOTE — Transfer of Care (Signed)
Immediate Anesthesia Transfer of Care Note  Patient: Thomas Arroyo  Procedure(s) Performed: COLONOSCOPY WITH PROPOFOL (N/A )  Patient Location: PACU  Anesthesia Type:General  Level of Consciousness: awake, alert  and oriented  Airway & Oxygen Therapy: Patient Spontanous Breathing and Patient connected to nasal cannula oxygen  Post-op Assessment: Report given to RN and Post -op Vital signs reviewed and stable  Post vital signs: Reviewed and stable  Last Vitals:  Vitals:   05/11/17 0840 05/11/17 0850  BP: (!) 135/98 130/86  Pulse: 89 80  Resp: 16 14  Temp: (!) 35.9 C   SpO2: 97% 98%    Last Pain:  Vitals:   05/11/17 0840  TempSrc: Tympanic  PainSc:          Complications: No apparent anesthesia complications

## 2017-05-12 ENCOUNTER — Encounter: Payer: Self-pay | Admitting: Gastroenterology

## 2017-05-14 NOTE — Anesthesia Postprocedure Evaluation (Signed)
Anesthesia Post Note  Patient: Thomas Arroyo  Procedure(s) Performed: COLONOSCOPY WITH PROPOFOL (N/A )  Patient location during evaluation: Endoscopy Anesthesia Type: General Level of consciousness: awake and alert Pain management: pain level controlled Vital Signs Assessment: post-procedure vital signs reviewed and stable Respiratory status: spontaneous breathing, nonlabored ventilation, respiratory function stable and patient connected to nasal cannula oxygen Cardiovascular status: blood pressure returned to baseline and stable Postop Assessment: no apparent nausea or vomiting Anesthetic complications: no     Last Vitals:  Vitals:   05/11/17 0900 05/11/17 0910  BP: (!) 135/95 (!) 126/100  Pulse: 69 66  Resp: 17 11  Temp:    SpO2: 99% 99%    Last Pain:  Vitals:   05/12/17 0746  TempSrc:   PainSc: 0-No pain                 Lenard SimmerAndrew Ejay Lashley

## 2017-12-16 ENCOUNTER — Other Ambulatory Visit: Payer: Self-pay

## 2017-12-16 ENCOUNTER — Emergency Department
Admission: EM | Admit: 2017-12-16 | Discharge: 2017-12-16 | Disposition: A | Discharge status: Home / Self Care | Payer: Non-veteran care | Attending: Emergency Medicine | Admitting: Emergency Medicine

## 2017-12-16 ENCOUNTER — Encounter: Payer: Self-pay | Admitting: Emergency Medicine

## 2017-12-16 ENCOUNTER — Emergency Department: Payer: Non-veteran care

## 2017-12-16 DIAGNOSIS — R0789 Other chest pain: Secondary | ICD-10-CM

## 2017-12-16 DIAGNOSIS — F17210 Nicotine dependence, cigarettes, uncomplicated: Secondary | ICD-10-CM | POA: Insufficient documentation

## 2017-12-16 DIAGNOSIS — K2900 Acute gastritis without bleeding: Secondary | ICD-10-CM | POA: Diagnosis not present

## 2017-12-16 DIAGNOSIS — I10 Essential (primary) hypertension: Secondary | ICD-10-CM | POA: Insufficient documentation

## 2017-12-16 LAB — CBC
HCT: 47.3 % (ref 39.0–52.0)
HEMOGLOBIN: 15.8 g/dL (ref 13.0–17.0)
MCH: 30.4 pg (ref 26.0–34.0)
MCHC: 33.4 g/dL (ref 30.0–36.0)
MCV: 91.1 fL (ref 80.0–100.0)
NRBC: 0 % (ref 0.0–0.2)
Platelets: 154 10*3/uL (ref 150–400)
RBC: 5.19 MIL/uL (ref 4.22–5.81)
RDW: 12.6 % (ref 11.5–15.5)
WBC: 9.9 10*3/uL (ref 4.0–10.5)

## 2017-12-16 LAB — BASIC METABOLIC PANEL
ANION GAP: 9 (ref 5–15)
BUN: 10 mg/dL (ref 6–20)
CHLORIDE: 108 mmol/L (ref 98–111)
CO2: 24 mmol/L (ref 22–32)
Calcium: 8.7 mg/dL — ABNORMAL LOW (ref 8.9–10.3)
Creatinine, Ser: 1.01 mg/dL (ref 0.61–1.24)
GFR calc non Af Amer: >60 mL/min (ref >60)
Glucose, Bld: 105 mg/dL — ABNORMAL HIGH (ref 70–99)
POTASSIUM: 3.3 mmol/L — AB (ref 3.5–5.1)
SODIUM: 141 mmol/L (ref 135–145)

## 2017-12-16 LAB — TROPONIN I: Troponin I: <0.03 ng/mL (ref <0.03)

## 2017-12-16 MED ORDER — MORPHINE SULFATE (PF) 4 MG/ML IV SOLN
4.0000 mg | Freq: Once | INTRAVENOUS | Status: AC
  Administered 2017-12-16: 4 mg via INTRAVENOUS
  Filled 2017-12-16: qty 1

## 2017-12-16 MED ORDER — ONDANSETRON HCL 4 MG/2ML IJ SOLN
4.0000 mg | Freq: Once | INTRAMUSCULAR | Status: AC
Start: 2017-12-16 — End: 2017-12-16
  Administered 2017-12-16: 4 mg via INTRAVENOUS
  Filled 2017-12-16: qty 2

## 2017-12-16 MED ORDER — GI COCKTAIL ~~LOC~~
30.0000 mL | Freq: Once | ORAL | Status: AC
Start: 2017-12-16 — End: 2017-12-16
  Administered 2017-12-16: 30 mL via ORAL

## 2017-12-16 MED ORDER — IOPAMIDOL (ISOVUE-370) INJECTION 76%
100.0000 mL | Freq: Once | INTRAVENOUS | Status: AC | PRN
  Administered 2017-12-16: 100 mL via INTRAVENOUS

## 2017-12-16 MED ORDER — PANTOPRAZOLE SODIUM 40 MG PO TBEC: 40.0000 mg | DELAYED_RELEASE_TABLET | Freq: Every day | ORAL | 1 refills | Status: DC

## 2017-12-16 MED ORDER — SUCRALFATE 1 G PO TABS: 1.0000 g | ORAL_TABLET | Freq: Four times a day (QID) | ORAL | 0 refills | Status: DC

## 2017-12-16 MED ORDER — GI COCKTAIL ~~LOC~~
ORAL | Status: AC
  Filled 2017-12-16: qty 30

## 2017-12-16 NOTE — ED Provider Notes (Signed)
[facility] Emergency Department Provider Note   ____________________________________________    I have reviewed the triage vital signs and the nursing notes.   HISTORY  Chief Complaint Chest Pain     HPI Thomas Arroyo is a 42 y.o. male who presents with complaints of chest pain.  Patient reports chest pain that radiates into his back.  He also reports occasionally the pain seems to start in his abdomen and radiates into his chest.  This is been ongoing over the last week and seems of worsened over the last 24 hours.  It is constant.  He denies shortness of breath.  No recent travel.  No calf pain or swelling.  No fevers chills nausea or vomiting.   Past Medical History:  Diagnosis Date  . Diverticulitis   . Enlarged tonsils   . Epiploic appendagitis   . Hypertension     Patient Active Problem List   Diagnosis Date Noted  . Abdominal pain, left lower quadrant   . Frequent bowel movements   . Rectal bleeding     Past Surgical History:  Procedure Laterality Date  . COLONOSCOPY WITH PROPOFOL N/A 05/11/2017   Procedure: COLONOSCOPY WITH PROPOFOL;  Surgeon: Toney Reil, MD;  Location: [facility] ENDOSCOPY;  Service: Gastroenterology;  Laterality: N/A;  . FOOT SURGERY      Prior to Admission medications   Medication Sig Start Date End Date Taking? Authorizing Provider  guaiFENesin (MUCINEX) 600 MG 12 hr tablet Take 600 mg by mouth 2 (two) times daily.    [provider]  ibuprofen (ADVIL,MOTRIN) 200 MG tablet Take 200 mg by mouth every 6 (six) hours as needed.    [provider]  pantoprazole (PROTONIX) 40 MG tablet Take 1 tablet (40 mg total) by mouth daily. 12/16/17 12/16/18  Jene Every, MD  sucralfate (CARAFATE) 1 g tablet Take 1 tablet (1 g total) by mouth 4 (four) times daily for 15 days. 12/16/17 12/31/17  Jene Every, MD     Allergies Bee venom and Oxycontin [oxycodone hcl]  No family history on  file.  Social History Social History   Tobacco Use  . Smoking status: Current Every Day Smoker    Packs/day: 1.00    Types: Cigarettes    Last attempt to quit: 12/21/2016    Years since quitting: 0.9  . Smokeless tobacco: Never Used  Substance Use Topics  . Alcohol use: Not Currently  . Drug use: No    Review of Systems  Constitutional: No fever/chills Eyes: No visual changes.  ENT: No sore throat. Cardiovascular: As above Respiratory: Denies shortness of breath. Gastrointestinal: As above Genitourinary: Negative for dysuria. Musculoskeletal: Negative for back pain. Skin: Negative for rash. Neurological: Negative for headaches or weakness   ____________________________________________   PHYSICAL EXAM:  VITAL SIGNS: ED Triage Vitals [12/16/17 2148]  Enc Vitals Group     BP (!) 145/87     Pulse Rate 95     Resp 18     Temp      Temp src      SpO2 98 %     Weight 108.9 kg (240 lb)     Height 1.88 m (6\' 2" )     Head Circumference      Peak Flow      Pain Score 9     Pain Loc      Pain Edu?      Excl. in GC?     Constitutional: Alert and oriented.  Eyes: Conjunctivae are normal.  Head: Atraumatic. Nose: No congestion/rhinnorhea. Mouth/Throat: Mucous membranes are moist.    Cardiovascular: Normal rate, regular rhythm. Grossly normal heart sounds.  Good peripheral circulation. Respiratory: Normal respiratory effort.  No retractions. Lungs CTAB. Gastrointestinal: Soft and nontender. No distention.    Musculoskeletal:   Warm and well perfused Neurologic:  Normal speech and language. No gross focal neurologic deficits are appreciated.  Skin:  Skin is warm, dry and intact. No rash noted. Psychiatric: Mood and affect are normal. Speech and behavior are normal.  ____________________________________________   LABS (all labs ordered are listed, but only abnormal results are displayed)  Labs Reviewed  BASIC METABOLIC PANEL - Abnormal; Notable for the  following components:      Result Value   Potassium 3.3 (*)    Glucose, Bld 105 (*)    Calcium 8.7 (*)    All other components within normal limits  CBC  TROPONIN I   ____________________________________________  EKG  ED ECG REPORT I, Jene Every, the attending physician, personally viewed and interpreted this ECG.  Date: 12/16/2017  Rhythm: normal sinus rhythm QRS Axis: normal Intervals: normal ST/T Wave abnormalities: normal Narrative Interpretation: no evidence of acute ischemia  ____________________________________________  RADIOLOGY  Chest x-ray unremarkable CT angiography shows no acute ab normality in the chest abdomen or pelvis ____________________________________________   PROCEDURES  Procedure(s) performed: No  Procedures   Critical Care performed: No ____________________________________________   INITIAL IMPRESSION / ASSESSMENT AND PLAN / ED COURSE  Pertinent labs & imaging results that were available during my care of the patient were reviewed by me and considered in my medical decision making (see chart for details).  Patient presents with chest pain x1 week with concerning radiation into the back and occasionally to the abdomen.  EKG is reassuring, troponin is normal, not consistent with ACS.  Sent for CT angiography given his complaints of chest pain radiating to the back.  CT angiography is negative for acute abnormalities.  Discussed labs and imaging results with the patient.  Suspicious for gastritis/esophagitis.  He is never had an endoscopy.  We will treat with PPI, Carafate and have him follow-up with gastroenterology.  Patient and his wife agree with this plan, they know they can return anytime    ____________________________________________   FINAL CLINICAL IMPRESSION(S) / ED DIAGNOSES  Final diagnoses:  Atypical chest pain  Acute gastritis without hemorrhage, unspecified gastritis type        Note:  This document was  prepared using Dragon voice recognition software and may include unintentional dictation errors.    Jene Every, MD 12/16/17 [phone]

## 2017-12-16 NOTE — ED Notes (Signed)
Per ed tech, pt refused wheelchair

## 2017-12-16 NOTE — ED Notes (Signed)
Pt reports no change in pain. Pt states pain is now moving to right chest. Pt appears in no acute distress. Pt updated on process time for ct results.

## 2017-12-16 NOTE — ED Triage Notes (Signed)
Patient with complaint of intermittent central chest pain times one week. Patient states that he has had shortness of breath, nausea and diaphoretic with the pain.

## 2018-03-27 ENCOUNTER — Ambulatory Visit
Admission: EM | Admit: 2018-03-27 | Discharge: 2018-03-27 | Disposition: A | Discharge status: Home / Self Care | Payer: Worker's Compensation | Attending: Family Medicine | Admitting: Family Medicine

## 2018-03-27 ENCOUNTER — Other Ambulatory Visit: Payer: Self-pay

## 2018-03-27 DIAGNOSIS — W228XXA Striking against or struck by other objects, initial encounter: Secondary | ICD-10-CM

## 2018-03-27 DIAGNOSIS — S80812A Abrasion, left lower leg, initial encounter: Secondary | ICD-10-CM

## 2018-03-27 DIAGNOSIS — Z23 Encounter for immunization: Secondary | ICD-10-CM

## 2018-03-27 DIAGNOSIS — T148XXA Other injury of unspecified body region, initial encounter: Secondary | ICD-10-CM | POA: Insufficient documentation

## 2018-03-27 MED ORDER — MUPIROCIN 2 % EX OINT: 1.0000 "application " | TOPICAL_OINTMENT | Freq: Two times a day (BID) | CUTANEOUS | 0 refills | Status: AC

## 2018-03-27 MED ORDER — TETANUS-DIPHTH-ACELL PERTUSSIS 5-2.5-18.5 LF-MCG/0.5 IM SUSP
0.5000 mL | Freq: Once | INTRAMUSCULAR | Status: AC
  Administered 2018-03-27: 0.5 mL via INTRAMUSCULAR

## 2018-03-27 NOTE — ED Triage Notes (Signed)
Patient states that he was at work and hit the lower left leg on a metal cart. Patient states that area at work wouldn't stop bleeding. Area has stopped bleeding.

## 2018-03-27 NOTE — ED Provider Notes (Signed)
[facility]    CSN: [id number] Arrival date & time: 03/27/18  1921  History   Chief Complaint Chief Complaint  Patient presents with  . Laceration    left leg   HPI  43 year old male presents patient with a wound on his left leg.  Patient reports that he was at work.  He accidentally hit his left lower leg on a metal cart.  Suffered a wound.  He states that the wound would not stop bleeding at work.  He states that his leg subsequently became "numb all over".  He thus was instructed to come in for evaluation.  There is no bleeding to his wound currently.  His tetanus is unknown.  No other reported symptoms.  No other complaints or concerns at this time.  PMH, Surgical Hx, Family Hx, Social History reviewed and updated as below.  Past Medical History:  Diagnosis Date  . Diverticulitis   . Enlarged tonsils   . Epiploic appendagitis   . Hypertension    Patient Active Problem List   Diagnosis Date Noted  . Abdominal pain, left lower quadrant   . Frequent bowel movements   . Rectal bleeding    Past Surgical History:  Procedure Laterality Date  . COLONOSCOPY WITH PROPOFOL N/A 05/11/2017   Procedure: COLONOSCOPY WITH PROPOFOL;  Surgeon: Toney Reil, MD;  Location: [facility] ENDOSCOPY;  Service: Gastroenterology;  Laterality: N/A;  . FOOT SURGERY     Home Medications    Prior to Admission medications   Medication Sig Start Date End Date Taking? Authorizing Provider  mupirocin ointment (BACTROBAN) 2 % Apply 1 application topically 2 (two) times daily for 7 days. 03/27/18 04/03/18  Tommie Sams, DO   Social History Social History   Tobacco Use  . Smoking status: Current Every Day Smoker    Packs/day: 1.00    Types: Cigarettes    Last attempt to quit: 12/21/2016    Years since quitting: 1.2  . Smokeless tobacco: Never Used  Substance Use Topics  . Alcohol use: Not Currently  . Drug use: No    Allergies   Bee venom and Oxycontin [oxycodone  hcl]   Review of Systems Review of Systems  Constitutional: Negative.   Skin: Positive for wound.   Physical Exam Triage Vital Signs ED Triage Vitals  Enc Vitals Group     BP 03/27/18 2026 (!) 143/97     Pulse Rate 03/27/18 2026 62     Resp 03/27/18 2026 18     Temp 03/27/18 2026 97.7 F (36.5 C)     Temp Source 03/27/18 2026 Oral     SpO2 03/27/18 2026 96 %     Weight 03/27/18 2024 240 lb (108.9 kg)     Height 03/27/18 2024 6\' 2"  (1.88 m)     Head Circumference --      Peak Flow --      Pain Score 03/27/18 2023 7     Pain Loc --      Pain Edu? --      Excl. in GC? --    Updated Vital Signs BP (!) 143/97 (BP Location: Left Arm)   Pulse 62   Temp 97.7 F (36.5 C) (Oral)   Resp 18   Ht 6\' 2"  (1.88 m)   Wt 108.9 kg   SpO2 96%   BMI 30.81 kg/m   Visual Acuity Right Eye Distance:   Left Eye Distance:   Bilateral Distance:    Right Eye Near:  Left Eye Near:    Bilateral Near:     Physical Exam Vitals signs and nursing note reviewed.  Constitutional:      General: He is not in acute distress.    Appearance: Normal appearance.  HENT:     Head: Normocephalic and atraumatic.  Eyes:     General: No scleral icterus.    Conjunctiva/sclera: Conjunctivae normal.  Pulmonary:     Effort: Pulmonary effort is normal. No respiratory distress.  Skin:    Comments: Left lower leg with a small superficial abrasion.  Neurological:     Mental Status: He is alert.  Psychiatric:        Mood and Affect: Mood normal.        Behavior: Behavior normal.    UC Treatments / Results  Labs (all labs ordered are listed, but only abnormal results are displayed) Labs Reviewed - No data to display  EKG None  Radiology No results found.  Procedures Procedures (including critical care time)  Medications Ordered in UC Medications  Tdap (BOOSTRIX) injection 0.5 mL (0.5 mLs Intramuscular Given 03/27/18 2045)    Initial Impression / Assessment and Plan / UC Course  I have  reviewed the triage vital signs and the nursing notes.  Pertinent labs & imaging results that were available during my care of the patient were reviewed by me and considered in my medical decision making (see chart for details).     43 year old male presents with a very small, superficial wound.  It is not amendable to repair.  It does not need repair.  It is very superficial and is not bleeding at this time.  Tetanus given.  Wound was dressed.  Bactroban ointment as prescribed.  Final Clinical Impressions(s) / UC Diagnoses   Final diagnoses:  Abrasion     Discharge Instructions     Antibiotic ointment as prescribed.  Take care  Dr. Adriana Simasook    ED Prescriptions    Medication Sig Dispense Auth. Provider   mupirocin ointment (BACTROBAN) 2 % Apply 1 application topically 2 (two) times daily for 7 days. 30 g Tommie Samsook, Jeymi Hepp G, DO     Controlled Substance Prescriptions [place] Controlled Substance Registry consulted? Not Applicable   Tommie SamsCook, Jaquail Mclees G, DO 03/27/18 2117

## 2018-03-27 NOTE — Discharge Instructions (Signed)
Antibiotic ointment as prescribed.  Take care  Dr. Adriana Simas

## 2021-02-24 ENCOUNTER — Emergency Department
Admission: EM | Admit: 2021-02-24 | Discharge: 2021-02-24 | Disposition: A | Discharge status: Home / Self Care | Payer: No Typology Code available for payment source | Attending: Emergency Medicine | Admitting: Emergency Medicine

## 2021-02-24 ENCOUNTER — Other Ambulatory Visit: Payer: Self-pay

## 2021-02-24 ENCOUNTER — Emergency Department: Payer: No Typology Code available for payment source

## 2021-02-24 DIAGNOSIS — W268XXA Contact with other sharp object(s), not elsewhere classified, initial encounter: Secondary | ICD-10-CM | POA: Diagnosis not present

## 2021-02-24 DIAGNOSIS — S61217A Laceration without foreign body of left little finger without damage to nail, initial encounter: Secondary | ICD-10-CM | POA: Diagnosis present

## 2021-02-24 MED ORDER — LIDOCAINE HCL (PF) 1 % IJ SOLN
5.0000 mL | Freq: Once | INTRAMUSCULAR | Status: DC
  Filled 2021-02-24: qty 5

## 2021-02-24 NOTE — ED Triage Notes (Signed)
Pt here with a laceration to his left hand when making a table. Pt had a lac to his knuckle on his pinky finger. Pt in NAD in triage.

## 2021-02-24 NOTE — ED Provider Notes (Signed)
Emergency Medicine Provider Triage Evaluation Note  Thomas Arroyo , a 45 y.o. male  was evaluated in triage.  Pt presents to the ER after laceration to the left pinky finger. He cut it with a hook blade while building a table. Tdap current.  Review of Systems  Positive: Laceration to the hand Negative: Active bleeding  Physical Exam  There were no vitals taken for this visit. Gen:   Awake, no distress   Resp:  Normal effort  MSK:   Moves extremities without difficulty  Other:    Medical Decision Making  Medically screening exam initiated at 4:07 PM.  Appropriate orders placed.  Thomas Arroyo was informed that the remainder of the evaluation will be completed by another provider, this initial triage assessment does not replace that evaluation, and the importance of remaining in the ED until their evaluation is complete.   Chinita Pester, FNP 02/24/21 1611    Merwyn Katos, MD 02/24/21 [phone]

## 2022-01-12 ENCOUNTER — Ambulatory Visit
Admission: EM | Admit: 2022-01-12 | Discharge: 2022-01-12 | Disposition: A | Discharge status: Home / Self Care | Payer: No Typology Code available for payment source | Attending: Urgent Care | Admitting: Urgent Care

## 2022-01-12 DIAGNOSIS — M25579 Pain in unspecified ankle and joints of unspecified foot: Secondary | ICD-10-CM | POA: Insufficient documentation

## 2022-01-12 DIAGNOSIS — M7740 Metatarsalgia, unspecified foot: Secondary | ICD-10-CM | POA: Insufficient documentation

## 2022-01-12 DIAGNOSIS — G4733 Obstructive sleep apnea (adult) (pediatric): Secondary | ICD-10-CM | POA: Insufficient documentation

## 2022-01-12 DIAGNOSIS — F4310 Post-traumatic stress disorder, unspecified: Secondary | ICD-10-CM | POA: Insufficient documentation

## 2022-01-12 DIAGNOSIS — H109 Unspecified conjunctivitis: Secondary | ICD-10-CM | POA: Insufficient documentation

## 2022-01-12 DIAGNOSIS — F17200 Nicotine dependence, unspecified, uncomplicated: Secondary | ICD-10-CM | POA: Insufficient documentation

## 2022-01-12 DIAGNOSIS — L0390 Cellulitis, unspecified: Secondary | ICD-10-CM | POA: Insufficient documentation

## 2022-01-12 DIAGNOSIS — G8929 Other chronic pain: Secondary | ICD-10-CM | POA: Insufficient documentation

## 2022-01-12 DIAGNOSIS — K2970 Gastritis, unspecified, without bleeding: Secondary | ICD-10-CM | POA: Insufficient documentation

## 2022-01-12 DIAGNOSIS — K2100 Gastro-esophageal reflux disease with esophagitis, without bleeding: Secondary | ICD-10-CM | POA: Insufficient documentation

## 2022-01-12 DIAGNOSIS — R1010 Upper abdominal pain, unspecified: Secondary | ICD-10-CM | POA: Insufficient documentation

## 2022-01-12 DIAGNOSIS — M7650 Patellar tendinitis, unspecified knee: Secondary | ICD-10-CM | POA: Insufficient documentation

## 2022-01-12 DIAGNOSIS — J029 Acute pharyngitis, unspecified: Secondary | ICD-10-CM | POA: Insufficient documentation

## 2022-01-12 DIAGNOSIS — M7918 Myalgia, other site: Secondary | ICD-10-CM | POA: Insufficient documentation

## 2022-01-12 DIAGNOSIS — Z202 Contact with and (suspected) exposure to infections with a predominantly sexual mode of transmission: Secondary | ICD-10-CM | POA: Insufficient documentation

## 2022-01-12 DIAGNOSIS — S99929A Unspecified injury of unspecified foot, initial encounter: Secondary | ICD-10-CM | POA: Insufficient documentation

## 2022-01-12 DIAGNOSIS — H11439 Conjunctival hyperemia, unspecified eye: Secondary | ICD-10-CM | POA: Insufficient documentation

## 2022-01-12 DIAGNOSIS — A64 Unspecified sexually transmitted disease: Secondary | ICD-10-CM | POA: Insufficient documentation

## 2022-01-12 DIAGNOSIS — T148XXA Other injury of unspecified body region, initial encounter: Secondary | ICD-10-CM | POA: Insufficient documentation

## 2022-01-12 DIAGNOSIS — Z7189 Other specified counseling: Secondary | ICD-10-CM | POA: Insufficient documentation

## 2022-01-12 DIAGNOSIS — M21619 Bunion of unspecified foot: Secondary | ICD-10-CM | POA: Insufficient documentation

## 2022-01-12 DIAGNOSIS — M2010 Hallux valgus (acquired), unspecified foot: Secondary | ICD-10-CM | POA: Insufficient documentation

## 2022-01-12 DIAGNOSIS — J069 Acute upper respiratory infection, unspecified: Secondary | ICD-10-CM | POA: Insufficient documentation

## 2022-01-12 DIAGNOSIS — S6700XA Crushing injury of unspecified thumb, initial encounter: Secondary | ICD-10-CM | POA: Insufficient documentation

## 2022-01-12 NOTE — ED Triage Notes (Signed)
Pt. Presents to UC w/ request for STD testing after being informed his wife is positive for HSV1 and HSV2, pt is requesting a full panel STD work-up.

## 2022-01-12 NOTE — Discharge Instructions (Signed)
The results of your testing today will be posted to your MyChart account.  If you have questions about the interpretation of results, please call our office or your primary care provider.

## 2022-01-12 NOTE — ED Provider Notes (Signed)
[facility]    CSN: [id number] Arrival date & time: 01/12/22  1627      History   Chief Complaint Chief Complaint  Patient presents with   Exposure to STD    HPI Thomas Arroyo is a 46 y.o. male.    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  Patient requesting full panel STD work-up. Denies personal infidelity but suspects his spouse. Requesting full STD panel.  Past Medical History:  Diagnosis Date   Diverticulitis    Enlarged tonsils    Epiploic appendagitis    Hypertension     Patient Active Problem List   Diagnosis Date Noted   Acute upper respiratory infection 01/12/2022   Bunion 01/12/2022   Cellulitis, unspecified 01/12/2022   Chronic back pain 01/12/2022   Conjunctival hyperemia 01/12/2022   Conjunctivitis 01/12/2022   Contusion 01/12/2022   Crushing injury of unspecified thumb, initial encounter 01/12/2022   Other specified counseling 01/12/2022   Gastritis, unspecified, without bleeding 01/12/2022   Gastro-esophageal reflux disease with esophagitis, without bleeding 01/12/2022   Hallux valgus, acquired 01/12/2022   Injury of foot 01/12/2022   Metatarsalgia 01/12/2022   Myofascial pain 01/12/2022   Nicotine dependence 01/12/2022   Obstructive sleep apnea (adult) (pediatric) 01/12/2022   Pain in joint, ankle and foot 01/12/2022   Patellar tendinitis 01/12/2022   Post-traumatic stress disorder 01/12/2022   Sore throat 01/12/2022   Tobacco use disorder 01/12/2022   Upper abdominal pain, unspecified 01/12/2022   Abdominal pain, left lower quadrant    Frequent bowel movements    Rectal bleeding     Past Surgical History:  Procedure Laterality Date   COLONOSCOPY WITH PROPOFOL N/A 05/11/2017   Procedure: COLONOSCOPY WITH PROPOFOL;  Surgeon: Toney Reil, MD;  Location: [facility] ENDOSCOPY;  Service: Gastroenterology;  Laterality: N/A;   FOOT SURGERY          Home Medications    Prior to Admission medications   Medication Sig Start Date End Date Taking? Authorizing Provider  amLODipine (NORVASC) 10 MG tablet TAKE ONE-HALF TABLET BY MOUTH EVERY DAY FOR BLOOD PRESSURE 01/12/21  Yes [provider]  cyclobenzaprine (FLEXERIL) 5 MG tablet Take by mouth. 06/18/20  Yes [provider]  diclofenac Sodium (VOLTAREN) 1 % GEL Apply topically. 06/18/20  Yes [provider]  lactase (LACTAID) 3000 units tablet TAKE TWO TABLETS BY MOUTH THREE TIMES A DAY WITH MEALS AS NEEDED TAKE AS NEEDED WHEN EATING DAIRY PRODUCTS 04/13/21  Yes [provider]  lidocaine (LIDODERM) 5 % 1 patch remove after 12 hours Externally Once a day for 30 days 02/17/21  Yes [provider]  tiZANidine (ZANAFLEX) 4 MG tablet 1 tablet as needed Orally Three times a day for 30 days 02/17/21  Yes [provider]  tobramycin-dexamethasone (TOBRADEX) ophthalmic solution INSTILL 1 DROP INTO AFFECTED EYE(S) BY OPHTHALMIC ROUTE EVERY 3 - 4 times a day 06/16/17  Yes [provider]  venlafaxine XR (EFFEXOR-XR) 37.5 MG 24 hr capsule TAKE ONE CAPSULE BY MOUTH ONCE EVERY DAY FOR 14 DAYS, THEN TAKE TWO CAPSULES ONCE EVERY DAY FOR 14 DAYS 01/12/21  Yes [provider]  gabapentin (NEURONTIN) 600 MG tablet Take by mouth.    [provider]  meloxicam (MOBIC) 15 MG tablet Take by mouth.    [provider]  methocarbamol (ROBAXIN) 500 MG tablet 1.5 tablets Orally every 4 hrs  [provider]  pantoprazole (PROTONIX) 40 MG tablet Take by mouth.    [provider]    Family History History reviewed. No pertinent family history.  Social History Social History   Tobacco Use   Smoking status: Every Day    Packs/day: 1.00    Types: Cigarettes    Last attempt to quit: 12/21/2016    Years since quitting: 5.0   Smokeless tobacco: Never  Vaping Use   Vaping Use: Every day  Substance Use  Topics   Alcohol use: Not Currently   Drug use: No     Allergies   Bee venom and Oxycontin [oxycodone hcl]   Review of Systems Review of Systems   Physical Exam Triage Vital Signs ED Triage Vitals [01/12/22 1704]  Enc Vitals Group     BP (!) 166/109     Pulse Rate 75     Resp 18     Temp 98.2 F (36.8 C)     Temp src      SpO2 95 %     Weight      Height      Head Circumference      Peak Flow      Pain Score 0     Pain Loc      Pain Edu?      Excl. in [place]?    No data found.  Updated Vital Signs BP (!) 166/109   Pulse 75   Temp 98.2 F (36.8 C)   Resp 18   SpO2 95%   Visual Acuity Right Eye Distance:   Left Eye Distance:   Bilateral Distance:    Right Eye Near:   Left Eye Near:    Bilateral Near:     Physical Exam Vitals reviewed.  Skin:    General: Skin is warm and dry.  Neurological:     General: No focal deficit present.     Mental Status: He is oriented to person, place, and time.  Psychiatric:        Mood and Affect: Mood normal.        Behavior: Behavior normal.      UC Treatments / Results  Labs (all labs ordered are listed, but only abnormal results are displayed) Labs Reviewed - No data to display  EKG   Radiology No results found.  Procedures Procedures (including critical care time)  Medications Ordered in UC Medications - No data to display  Initial Impression / Assessment and Plan / UC Course  I have reviewed the triage vital signs and the nursing notes.  Pertinent labs & imaging results that were available during my care of the patient were reviewed by me and considered in my medical decision making (see chart for details).   Penile swab and blood work ordered for patient.   Final Clinical Impressions(s) / UC Diagnoses   Final diagnoses:  None   Discharge Instructions   None    ED Prescriptions   None    PDMP not reviewed this encounter.   Rose Phi, [place] 01/12/22 1730

## 2022-01-13 LAB — CYTOLOGY, (ORAL, ANAL, URETHRAL) ANCILLARY ONLY
Chlamydia: NEGATIVE
Comment: NEGATIVE
Comment: NEGATIVE
Comment: NORMAL
Neisseria Gonorrhea: NEGATIVE
Trichomonas: NEGATIVE

## 2022-01-13 LAB — HIV ANTIBODY (ROUTINE TESTING W REFLEX): HIV Screen 4th Generation wRfx: NONREACTIVE

## 2022-01-19 LAB — HEPATITIS B CORE ANTIBODY, TOTAL: Hep B Core Total Ab: NEGATIVE

## 2022-01-19 LAB — HSV 1 ANTIBODY, IGG: HSV 1 Glycoprotein G Ab, IgG: 60.9 index — ABNORMAL HIGH (ref 0.00–0.90)

## 2022-01-19 LAB — HEPATITIS C ANTIBODY: Hep C Virus Ab: NONREACTIVE

## 2022-01-19 LAB — HEPATITIS B SURFACE ANTIGEN: Hepatitis B Surface Ag: NEGATIVE

## 2022-01-19 LAB — HEPATITIS B SURFACE ANTIBODY,QUALITATIVE: Hep B Surface Ab, Qual: NONREACTIVE

## 2022-01-19 LAB — RPR: RPR Ser Ql: NONREACTIVE

## 2022-01-20 LAB — SPECIMEN STATUS REPORT

## 2022-01-20 LAB — HSV-2 AB, IGG: HSV 2 IgG, Type Spec: <0.91 index (ref 0.00–0.90)

## 2022-01-21 ENCOUNTER — Telehealth: Payer: Self-pay | Admitting: Urgent Care

## 2022-01-21 DIAGNOSIS — B009 Herpesviral infection, unspecified: Secondary | ICD-10-CM

## 2022-01-21 MED ORDER — VALACYCLOVIR HCL 1 G PO TABS: 2000.0000 mg | ORAL_TABLET | Freq: Two times a day (BID) | ORAL | 0 refills | Status: AC

## 2022-01-21 NOTE — Telephone Encounter (Signed)
Called patient to verify communication of results. 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
